# Patient Record
Sex: Male | Born: 1940 | Race: White | Hispanic: No | Marital: Married | Smoking: Never smoker
Health system: Southern US, Community
[De-identification: ages and names within clinical notes are randomized; demographics above are authoritative.]

## PROBLEM LIST (undated history)

## (undated) DIAGNOSIS — E119 Type 2 diabetes mellitus without complications: Secondary | ICD-10-CM

## (undated) DIAGNOSIS — I639 Cerebral infarction, unspecified: Secondary | ICD-10-CM

---

## 2000-11-08 ENCOUNTER — Encounter: Admission: RE | Admit: 2000-11-08 | Discharge: 2000-11-08 | Payer: Self-pay | Admitting: Family Medicine

## 2000-11-08 ENCOUNTER — Encounter: Payer: Self-pay | Admitting: Family Medicine

## 2007-05-03 ENCOUNTER — Encounter (INDEPENDENT_AMBULATORY_CARE_PROVIDER_SITE_OTHER): Payer: Self-pay | Admitting: Pediatrics

## 2007-05-03 ENCOUNTER — Encounter: Payer: Self-pay | Admitting: Emergency Medicine

## 2007-05-03 ENCOUNTER — Ambulatory Visit: Payer: Self-pay | Admitting: Internal Medicine

## 2007-05-03 ENCOUNTER — Encounter (INDEPENDENT_AMBULATORY_CARE_PROVIDER_SITE_OTHER): Payer: Self-pay | Admitting: Internal Medicine

## 2007-05-03 ENCOUNTER — Inpatient Hospital Stay (HOSPITAL_COMMUNITY): Admission: AD | Admit: 2007-05-03 | Discharge: 2007-05-09 | Payer: Self-pay | Admitting: Internal Medicine

## 2007-05-05 ENCOUNTER — Encounter (INDEPENDENT_AMBULATORY_CARE_PROVIDER_SITE_OTHER): Payer: Self-pay | Admitting: Pediatrics

## 2007-05-05 ENCOUNTER — Ambulatory Visit: Payer: Self-pay | Admitting: Physical Medicine & Rehabilitation

## 2007-05-08 ENCOUNTER — Encounter: Payer: Self-pay | Admitting: Internal Medicine

## 2007-05-09 ENCOUNTER — Ambulatory Visit: Payer: Self-pay | Admitting: Physical Medicine & Rehabilitation

## 2007-05-09 ENCOUNTER — Inpatient Hospital Stay (HOSPITAL_COMMUNITY)
Admission: RE | Admit: 2007-05-09 | Discharge: 2007-06-06 | Payer: Self-pay | Admitting: Physical Medicine & Rehabilitation

## 2007-07-15 ENCOUNTER — Encounter
Admission: RE | Admit: 2007-07-15 | Discharge: 2007-07-16 | Payer: Self-pay | Admitting: Physical Medicine & Rehabilitation

## 2007-07-16 ENCOUNTER — Ambulatory Visit: Payer: Self-pay | Admitting: Physical Medicine & Rehabilitation

## 2007-10-06 ENCOUNTER — Encounter
Admission: RE | Admit: 2007-10-06 | Discharge: 2007-12-09 | Payer: Self-pay | Admitting: Physical Medicine & Rehabilitation

## 2007-10-06 ENCOUNTER — Ambulatory Visit: Payer: Self-pay | Admitting: Physical Medicine & Rehabilitation

## 2010-10-15 ENCOUNTER — Encounter: Payer: Self-pay | Admitting: Physical Medicine & Rehabilitation

## 2011-02-06 NOTE — Discharge Summary (Signed)
NAMENATION, CRADLE NO.:  [id number]   MEDICAL RECORD NO.:  [id number]          PATIENT TYPE:  IPS   LOCATION:  4033                         FACILITY:  [facility]   PHYSICIAN:  Ranelle Oyster, M.D.DATE OF BIRTH:  Apr 23, 1941   DATE OF ADMISSION:  05/09/2007  DATE OF DISCHARGE:  06/06/2007                               DISCHARGE SUMMARY   DISCHARGE DIAGNOSIS:  1. Bilateral subcortical infarct.  2. Diabetes mellitus, type 2.  3. Hypertension.  4. Benign prostatic hypertrophy.  5. Dyslipidemia.   DICTATION ENDS HERE      Greg Cutter, P.A.      Ranelle Oyster, M.D.  Electronically Signed    PP/MEDQ  D:  06/06/2007  T:  06/07/2007  Job:  [id number]

## 2011-02-06 NOTE — Assessment & Plan Note (Signed)
HISTORY OF PRESENT ILLNESS:  Mr. [name] is back regarding his  bilateral subcortical strokes. She has been home with home health  therapy. His biggest complaint is right shoulder pain, which seems to be  getting worse, particularly at night time. He has been receiving therapy  daily with PT and OT and is doing well there. He is able to walk around  the house with his walker. He is not using and AFO. His hand has been  slower to return. The patient does report some aching in the shoulder  but really no where else. Sleep has been poor secondary to increased  pain. Family and some of the staff that he has been working with  reported decreased mood. The patient denies that he has a problem and  does not want to take extra medication for this. Sugars have been  better controlled. He does report an occasional constipation.   REVIEW OF SYSTEMS:  Is notable for the items above. Full review is in  the written health and history section.   SOCIAL HISTORY:  The patient is married and family is supportive.   PHYSICAL EXAMINATION:  Blood pressure 121/64, pulse 63, respiratory rate  18, sating 99% on room air.  GENERAL:  Pleasant, alert, and oriented x3.  NEUROLOGIC:  A bit irritable at times. Generally appropriate. The  patient was able to walk with hand held assistance, although he had some  foot drop on the right and some hyper-extension at the knee. He was  fairly stable. Strength was 3 to 3+ out of 5 at the ankle, 3+ to 4 out  of 5 at the knee and hip. He still lacks some proprioception. Sensory  examination appears to be 1 out of 2. Reflexes are a bit hyper-active at  2+ plus. Right upper extremity strength is in the realm of trace to 1+  and variable. He had some tone at the biceps at 1+ out of 4. Shoulder  was tight and had pain with abduction and flexion today. He has positive  rotator cuff signs. Bicipital tendons were minimally tender. There was  1+ swelling at the right hand and wrist  today. Speech was slightly  dysarthric but intelligible. Otherwise, examination is stable.   ASSESSMENT:  1. Bilateral subcortical infarcts.  2. Hypertension.  3. Diabetes.  4. Right rotator cuff tendinitis/adhesive capsulitis.   PLAN:  1. After informed consent, we injected the right shoulder via the      posterior approach with 40 mg of Kenalog and 3 cc of 1% Lidocaine.      The patient tolerated well.  2. Gave patient a few Ultram to use. He will take 1/2 to 1 at bedtime      as needed for breakthrough pain.  3. After pain has subsided a bit, would like to have therapy      aggressively work on range of motion to the right shoulder.  4. Advised the patient to resume Plavix until he follows up with Dr.      Sharene Skeans. Apparent plan before was to look at another bubble study.  5. Continue blood pressure medications including Prinivil and Norvasc.  6. Observe sugars closely, as he has been doing. The patient remains      on Lantus and Amaryl.  7. I will see the patient back in 3 months time, if not sooner for      pain needs.      Ranelle Oyster, M.D.  Electronically Signed  ZTS/MedQ  D:  07/16/2007 10:20:02  T:  07/16/2007 19:34:49  Job #:  [id number]   cc:   Deanna Artis. Sharene Skeans, M.D.  Fax: [phone]   Doris Cheadle. Foy Guadalajara, M.D.  Fax: [phone]

## 2011-02-06 NOTE — Consult Note (Signed)
NAMEROYE, GUSTAFSON NO.:  [id number]   MEDICAL RECORD NO.:  [id number]          PATIENT TYPE:  INP   LOCATION:  3111                         FACILITY:  [facility]   PHYSICIAN:  Deanna Artis. Hickling, M.D.DATE OF BIRTH:  [birth year]   DATE OF CONSULTATION:  05/03/2007  DATE OF DISCHARGE:                                 CONSULTATION   REFERRING PHYSICIAN:  Dr. Cammie Mcgee L. Lendell Caprice   CHIEF COMPLAINT:  Unsteady gait, problems with speech, facial droop and  falling with right-sided weakness.   IDENTIFICATION:  I was asked by Dr. Crista Curb of [facility] to see Mr. Whitman.  The patient had onset of symptoms  around 10 p.m., August 8.  He was walking the dog; as he did so, he felt  quite unsteady on his feet.   A week ago, while pending on over, he fell over and braced himself so  that he would not hit the dog and snagged his head on a stone.  He had a  deep abrasion of the scalp.  He did not have any other focal symptoms at  that time.   When the patient felt dizzy last night, he thought that he had similar  symptoms.  He takes Flomax at nighttime and often is a bit dizzy after  he takes it.   This morning on arising, however, his wife heard fall in the kitchen.  He felt increased weakness on his right side and collapsed.   He was noted to be dysarthric.  He had a slight facial droop.  He had  mild weakness in the right arm and leg.  He had incoordination on the  right side.   The patient was initially evaluated at 10:40 in the morning by Dr.  Lendell Caprice.  I was asked to come see the patient, but was unable to leave  [facility] for [employer].   Around 2:52, the patient's speech worsened, he was drooling, he was  somewhat more sleepy.  He seemed to be picking at the air.  His  dysarthria was greater.  His right facial droop seemed to be worse than  his arm was weaker.   I was contacted and told Dr. Lendell Caprice that this was to be expected from  subcortical stroke.  She informed me and I have since reviewed the MRI  scan that the patient had an acute left putaminal stroke that extends  into the left corona radiata.  The patient also has a right corona  radiata infarction.  These are not the same age; the right side seems a  bit older than the left, based on MRI criteria.  The patient has rather  significant small vessel white matter disease surrounding the white  matter, but not significantly extending into the brain stem.   MRA showed vasculopathy; however, I did not see true vessel occlusion.  The patient seemed to have fairly normal carotid circulation;  There was  some narrowing and irregularity of the vessel inside the head, but no  hemodynamically significant lesion that I could see.  The patient  refused to have an MRA  of the neck.   Because of bilateral lesions and our inability to determine the presence  or absence of significant proximal occlusion of his vessels, the was  placed on heparin and will be on this weekend until we can obtain  noninvasive vascular studies at the beginning of the week.   We also decided to transfer the patient to [facility] for  further workup.   Rick factors for stroke include diabetes mellitus, hypertension,  dyslipidemia and noncompliance with medical regimen.  On many occasions,  his primary physician has requested that he take medication and he has  refused to do so.  He has also refused to have cardiac evaluation,  despite the fact that the patient has had diabetes for over 30 years.  He has become insulin dependent.  I do not know whether he checks his  sugars on a regular basis.   Despite this, the patient is very active.  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  He does not smoke and has not smoked.  His only other medical problem is benign prostatic hypertrophy.   CURRENT MEDICATIONS:  1. Glimepiride 4 mg daily.  2. Lantus 60 units daily.  3. Flomax 0.4  mg daily.  4. He has received NovoLog sliding scale insulin, Vasotec, and      heparin.   We have stopped his oral medications because he failed his swallowing  screen.  Blood pressure has varied between 90 and 210 systolic and 85  and 95 diastolic.   While on 1614 before he was transferred, his blood pressure was down to  170/89.   FAMILY HISTORY:  Positive for stroke in his mother; she died at age 48  of a heart attack.  His father had atherosclerotic cardiovascular  disease as well.   EXAMINATION:  VITAL SIGNS:  On examination today, blood pressure 198/82,  resting pulse 61, respirations 14, oxygen saturation 98%, temperature  97, oxygen saturation 98% on 2 L of oxygen.  ENT:  No infections.  No bruits.  A scalp lesion is present.  LUNGS:  Clear.  HEART:  No murmurs.  Pulses normal.  ABDOMEN:  Soft, nontender.  Bowel sounds normal.  EXTREMITIES:  Well-formed without edema, cyanosis, alterations in tone  or tight heel cords.  There are some scrapes on his legs probably from  falls.  NEUROLOGIC:  Awake, dysarthric, without dysphasia.  Cranial nerves:  Round, reactive pupils.  Visual fields full.  Extraocular movements  full.  He has a right central 7th, midline tongue.  He weakly raises his  uvula and is dysphonic with his voice.  on motor examination, 4+/5  deltoids, 5/5 in all other muscles in the upper extremities and lower  extremities.  He has clumsy fine motor movements and right finger  tapping and finger apposition; he is fine on the left.  He has  cerebellar dystaxia on finger-to-nose on the right; he is okay with  finger-to-nose on the left and heel-knee-shin bilaterally.  Gait was not  tested.  Reflexes were normal to brisk, except the ankles, which were  absent.  He had bilateral flexor plantar responses.  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  He had  good stereoagnosis.   IMPRESSION:  Bilateral subcortical,  non-hemorrhagic infarctions, right  older than the left. (434.01, 433.11) I strongly believe that this is  subcortical white matter disease and probably not embolic; however, I  cannot rule that out and for that reason,  we will keep him on heparin in  therapeutic range with no bolus until we can look at his carotid Doppler  and 2-D echocardiogram.   The patient has significant dysphagia; he failed a swallowing study.  We  will keep him nothing-by-mouth.  He has a mild right hemiparesis and  right hemidystaxia. His NIH stroke scale was 4. His modified Rankin  prior to admission  was 0.  He is hypertensive and possibly requiring parenteral therapy to  keep his blood pressure between 180 and 200 systolic and 80-100  diastolic.  We will provide ongoing assistance to the [facility] Team.  I have discussed the case thoroughly with his wife and  daughter.      Deanna Artis. Sharene Skeans, M.D.  Electronically Signed     [facility]/MEDQ  D:  05/03/2007  T:  05/05/2007  Job:  [id number]   cc:   Molly Maduro L. Foy Guadalajara, M.D.

## 2011-02-06 NOTE — Assessment & Plan Note (Signed)
Mr. [name] is back regarding his bilateral subcortical strokes.  He is  at outpatient PT and OT now through [facility].  He is having it done  the [place] location.  He is walking with a quad cane.  He is  independent with self care and mobility with extra time.  His right  shoulder has been uncomfortable, but much less tender than what it was.  He rates his pain there as a 2 out of 10.  He is not using an AFO on his  right foot.  His mood has been fair to improved.   REVIEW OF SYSTEMS:  Notable for above.  He still reports decreased  balance/dizziness with walking.   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  He wishes to get back to that  and needs to be back on the job by April 1st.   PHYSICAL EXAMINATION:  Blood pressure is 152/74, pulse is 84,  respiratory rate 18, he is satting 98% on room air.  The patient is generally pleasant, alert and oriented x3.  He is less  irritable today.  Speech is slightly dysarthric.  He lacks some proprioception on the  right side, with overall pinprick at 1 out of 2.  Reflexes are 2++ on  the right side.  Strength is 4 out of 5 at the hip and knee, and 3 out  of 5 at the ankle on the right.  Right upper extremity is 1+ to 2 out of  5.  He did  have tightness of abduction and rotation of the shoulder.  He seems to be predominantly tight at the right pectoris major.  HEART:  Regular.  CHEST:  Clear.  ABDOMEN:  Soft, nontender.   ASSESSMENT:  1. Bilateral subcortical infarct.  2. Hypertension.  3. Diabetes.  4. Right rotator cuff tendinitis/adhesive capsulitis.   PLAN:  1. Continue therapy with physical therapy and occupational therapy.  I      encouraged aggressive pectoralis major stretching.  The patient      might benefit from Botox to the pec major.  2. Continue walking with cane.  I suggest focusing therapy towards the      patient's work-related activities.   He seems bound and determined      to get back on the job by April 1st.  I have questions concerning      his safety along these lines.  3. The patient will drive a limited distance to therapy during the      day, but I recommend nothing further than that.  4. Continue Prinivil, Norvasc and Plavix per neurology      recommendations.  5. I will see the patient back in about 2 months' time.      Ranelle Oyster, M.D.  Electronically Signed     ZTS/MedQ  D:  10/06/2007 13:53:53  T:  10/06/2007 18:12:28  Job #:  [id number]   cc:   Deanna Artis. Sharene Skeans, M.D.  Fax: [phone]   Doris Cheadle. Foy Guadalajara, M.D.  Fax: [phone]

## 2011-02-06 NOTE — Discharge Summary (Signed)
NAMEWILLY, [name] NO.:  [id number]   MEDICAL RECORD NO.:  [id number]          PATIENT TYPE:  IPS   LOCATION:  4033                         FACILITY:  [facility]   PHYSICIAN:  Ranelle Oyster, M.D.DATE OF BIRTH:  [birth year]   DATE OF ADMISSION:  05/09/2007  DATE OF DISCHARGE:  06/06/2007                               DISCHARGE SUMMARY   DISCHARGE DIAGNOSES:  1. Bilateral subcortical infarcts.  2. Hypertension.  3. Diabetes mellitus type 2.  4. Dyslipidemia.   HISTORY OF PRESENT ILLNESS:  Mr. Bergfeld is a 70 year old male with  history of diabetes mellitus and chronic low back pain, admitted August  9 with slurred speech, facial droop and right-sided weakness.  MRI of  brain showed left lenticular, left corona radiata, right posterior  corona radiata acute infarcts, moderate small-vessel disease, moderate  intracranial atherosclerosis with question of right vertebral artery  occlusion.  A 2-D echo done showed EF of 60-65% with mild AV  calcification.  Carotid Dopplers done showed left 60-79% ICA stenosis  and right 40-50% ICA stenosis, bilateral vertebral artery flow  antegrade.  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  Neuro recommends Plavix for CVA  prophylaxis with outpatient bubble study in the future.  The patient is  noted to have dysphagia with frank aspiration of nectar and difficulty  with chin tuck, currently on D2 diet, honey-thick liquids.  Currently  continues with dense right hemiparesis with right facial droop and  decrease in mobility.  Has been able to stand and perform some transfers  with right knee block.  Rehab consulted for further therapies.   PAST MEDICAL HISTORY:  1. BPH.  2. DM type 2 for 30 years with diabetic retinopathy.  3. Chronic low back pain.  4. Dyslipidemia.  5. Renal calculi with history of hydronephrosis in the past.   ALLERGIES:  No known drug allergies.   FAMILY  HISTORY:  Positive for CVA and liver cancer.   SOCIAL HISTORY:  The patient is married, lives in a one-level home with  2-3 steps at entry.  Does not use any tobacco or alcohol.  Still works  full-time.  The patient was independent prior to admission.   HOSPITAL COURSE:  Mr. Kmarion Rawl was admitted to rehab on May 09, 2007, for inpatient therapies to consist of PT, OT and speech  therapy.  Past admission the patient was maintained on Plavix for CVA  prophylaxis.  Blood pressures were monitored on b.i.d. basis and  initially showed poor control.  The patient's Norvasc was increased to  10 mg in addition to Prinivil 40 mg a day and at time of discharge blood  pressures ranging from 120s to 130 /20 is to 130 systolic, in 60s range  diastolic.  CBGs were monitored on an a.c., h.s. basis and the patient's  oral hypoglycemic regimen has been titrated for tighter blood sugar  control.  At time of discharge blood sugars ranging from 107 to 150s  range.  The patient's p.o. intake has been good.  Initially the patient  was maintained on  dysphagia diet with a follow-up of electrolytes.  Repeat swallow study was done on August 22 and the patient was advanced  to a regular diet, thin liquids.  He said continues to require chin tuck  with throat-clearing to help clear vallecula residues.  Speech therapy  has been working with the patient for dysphagia treatment and currently  the patient is independent for maintaining through his aspiration  precautions.  He continues to require minimum assist to use strategies  for speech intelligibility.   Labs done past admission revealed hemoglobin 13.6, hematocrit 38.3,  white count 5.8, platelets 186.  Check of electrolytes has shown some  renal insufficiency and the patient is encouraged push p.o. fluids.  Last labs from September 4 revealed sodium 138, potassium 4.0, chloride  106, CO2 26, BUN 24, creatinine 1.18, glucose 100.  The patient has had   multiple questions regarding his medications and medication management  as well as cost.  Both the patient and wife have been instructed about  the importance of medication adherence as well as need for current  medications for blood pressure control as well as for dyslipidemia and  diabetes monitoring.  They are to follow up with Dr. Foy Guadalajara for further  adjustments of medication in the future.  Physical therapy has been  working on the patient in terms of mobility.  Currently the patient is  at minimum assist for ambulating 120 feet with a rolling walker,  occasionally requiring moderate assist secondary to decreased balance.  The patient is modified independent for wheelchair mobility up to 180  feet.  PT has been working on the patient for upright posture as well as  backing up to chair for safety, practice sitting down.  OT has been  working with the patient for range of motion exercises for right  shoulder and right upper extremity use with self-care.  They have also  focused on self-care and balance issues with right knee extension for  peri care and for lower body care.  The patient currently shows  increased active movement, right lower extremity, as well incorporating  the right lower extremity for weight shifting during dynamic balance.  The patient will continue to receive further follow-up home health PT,  OT by [facility] past discharge.  Home health RN has been  arranged to help assist with medication management and diabetes follow-  up.  On June 06, 2007, the patient is discharged to home.   DISCHARGE MEDICATIONS:  1. Flomax 0.4 mg p.o. q.h.s.  2. Prinivil 40 mg a day.  3. Zocor 20 mg q.p.m.  4. Plavix 75 mg a day.  5. Norvasc 10 mg a day.  6. Amaryl 4 mg 1-1/2 p.o. per day.  7. Lantus insulin 75 units q.a.m.  8. Senokot-S two p.o. q.h.s.   ACTIVITIES:  Twenty-four hour supervision and assistance for walking.  No alcohol, no smoking, no driving.    SPECIAL INSTRUCTIONS:  Check blood sugars a.c., h.s.  Follow diabetic  diet.  [facility] to provide PT, OT and RN.   FOLLOW-UP:  The patient to follow up with Dr. Riley Kill October 22 at 9:20  a.m.  Follow up with Dr. Foy Guadalajara September 16 at 1:45.  follow up with Dr.  Sharene Skeans in 1 month for further input regarding repeat bubble study and  further workup.      Greg Cutter, P.A.      Ranelle Oyster, M.D.  Electronically Signed    PP/MEDQ  D:  06/06/2007  T:  06/07/2007  Job:  [id number]   cc:   Deanna Artis. Sharene Skeans, M.D.  Robert L. Foy Guadalajara, M.D.

## 2011-02-06 NOTE — H&P (Signed)
NAMEABHIMANYU, CRUCES NO.:  [id number]   MEDICAL RECORD NO.:  [id number]          PATIENT TYPE:  IPS   LOCATION:  4033                         FACILITY:  [facility]   PHYSICIAN:  Ellwood Dense, M.D.   DATE OF BIRTH:  10-Mar-1941   DATE OF ADMISSION:  05/09/2007  DATE OF DISCHARGE:                              HISTORY & PHYSICAL   PRIMARY CARE PHYSICIAN:  Robert L. Foy Guadalajara, M.D.   NEUROLOGIST:  Deanna Artis. Sharene Skeans, M.D.   CARDIOLOGIST:  Noralyn Pick. Eden Emms, MD, [facility].   HISTORY OF PRESENT ILLNESS:  Mr. Powe is a 70 year old adult male  with a history of diabetes mellitus and chronic low back pain.  He was  admitted on May 03, 2007, with slurred speech along with right facial  droop and right sided weakness.  MRI study of the brain showed left  lenticular, left corona radiata, and right posterior corona radiata  acute infarcts along with moderate small vessel disease and moderate  intracranial atherosclerosis with questionable right vertebral artery  occlusion.  A 2D echocardiogram showed an ejection fraction of 60-65%  with mild aortic valve calcification.  Carotid Doppler showed left  internal carotid artery stenosis at 60-79% and right stenosis at 40-59%.  There was bilateral vertebral artery flow which was antegrade.  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  Neurology  recommended Plavix for stroke prophylaxis and outpatient bubble study to  be done.  The patient has dysphagia along with frank aspiration of  nectar and difficulty with chin tucks.  He has been placed on a D-2  honey-thick liquid diet.  He continues with dense right hemiparesis  along with right facial droop.  Therapy has been ongoing.  The patient  requires total assist approximately doing 75% for transfers with his  right knee blocked.  The patient was evaluated by the rehabilitation  physicians and felt to be an  appropriate candidate for inpatient  rehabilitation.   REVIEW OF SYSTEMS:  Positive for weakness.   PAST MEDICAL HISTORY:  1. Benign prostatic hypertrophy.  2. Type 2 diabetes mellitus x30 years with diabetic retinopathy.  3. Chronic low back pain.  4. Dyslipidemia.  5. History of renal calculi with a history of hydronephrosis.   FAMILY HISTORY:  Positive for stroke and liver cancer.   SOCIAL HISTORY:  The patient is married and lives with his wife in a 1-  level home with 2-3 steps to enter.  he does not use alcohol or tobacco.  He had still been working full time as a Psychologist, forensic.   FUNCTIONAL HISTORY:  Prior to admission independent and working.   ALLERGIES:  No known drug allergies.   MEDICATIONS:  Prior to admission:  1. Flomax 0.4 mg daily.  2. Saw palmetto daily.  3. Glimepiride 4 mg daily.  4. Lantus insulin 60 units q.a.m.   LABORATORY:  Most recent hemoglobin level was 12.8 with a hematocrit of  37, platelet count of 184,000, a white count of 4.8.  Most recent sodium  was 141, potassium 3.8, chloride 106, CO2 27,  BUN 17, and creatinine  1.02 with glucose of 206.  Homocystine level is 14.5.  Total cholesterol  was 186 with HDL cholesterol 29, LDL cholesterol of 100, and  triglycerides of 286.  Chest x-ray, May 03, 2007, showed pulmonary  vascular congestion with possible chronic bronchitic changes with  atelectasis at the base.  Right shoulder x-rays were negative.   PHYSICAL EXAMINATION:  GENERAL:  A reasonably well-appearing, ruddy  complexion, adult male lying in bed in no acute discomfort.  VITAL SIGNS:  Blood pressure is 158/86 with a pulse of 70, respiratory  rate 20, and temperature 98.1 with recent blood sugars of 247, 225, and  330.  HEENT:  Normocephalic, nontraumatic.  CARDIOVASCULAR:  Regular rate and rhythm.  S1 S2 without murmurs.  ABDOMEN:  Soft, nontender with positive bowel sounds.  LUNGS:  Clear to auscultation bilaterally.  NEUROLOGIC:   Alert and oriented x3 with dysarthric speech and facial  weakness on the right with tongue deviated to the right.  EXTREMITIES:  Left upper and left lower extremity  exam showed 5/5  strength to all.  Bulk and tone were normal.  Reflexes were 2 plus and  symmetrical.  Sensation was intact to light touch throughout the left  upper and left lower extremity.  The right upper extremity strength was  0-1/5 throughout.  Bulk was normal and tone was decreased.  Sensation  was intact to light touch throughout the right upper extremity.  The  right lower extremity exam showed 1/5 strength in knee extension and 0-  1/5 strength throughout.  Sensation was intact to light touch on the  right lower extremity.   IMPRESSION:  Status post left lenticular, left corona radiata and right  posterior corona radiata acute infarcts with significant left  hemiparesis along with dysarthria and dysphagia but no significant  aphasia.   Presently, this patient has deficits in ADLs, transfers, ambulation,  speech and swallowing related to the above noted stroke.   PLAN:  Admit to the rehabilitation unit for daily therapy to include:  1. Physical therapy for range of motion, strengthening, bed mobility,      transfers, pre-gait training, gait training, and equipment eval.      2.  Occupational therapy for range of motion, strengthening, ADLs,      cognitive/perceptual training, splinting, and equipment eval.  3.      Rehab nursing for skin care, wound care, bowel and bladder training      as necessary.  2. Speech therapy for oral motor exercises, communication aid, vital      stim, and evaluation of swallow with swallow studies as necessary.  3. Case management to assess home environment , assist with discharge      planning, and arrange for appropriate followup care.  4. Social worker to assess family and social support, counsel the      patient regarding disability issues, and assist in discharge       planning.  5. Continue D-2 honey-thick liquids with full supervision.  6. Flomax 0.4 mg p.o. q.h.s.  7. Protonix 40 mg p.o. daily.  8. Lantus insulin 60 units subcutaneous q.a.m. along with sliding      scale NovoLog insulin and CBG a.c. and h.s.  9. Prinivil 40 mg p.o. daily.  10.Zocor 20 mg p.o. q.p.m.  11.Plavix 75 mg p.o. daily.  12.No straws with meds crushed and administered in puree.  13.Check for pocketing during meals.  14.Catapres 0.1 mg p.o. q.6 h. p.r.n. for a systolic  blood pressure      greater than 180, or a diastolic blood pressure greater than 90.  15.Normal saline IV fluids 75 cc/hr to run 7 p.m. to 7 a.m. daily.  16.Lovenox 40 mg subcutaneous for DVT prophylaxis.  17.E-stim with speech therapy daily.  18.Dulcolax suppository one per rectum daily.  19.Senokot-S 2 tablets p.o. q.h.s.  20.Sorbitol 45 mL p.o. if not results with suppository.  21.Patch right eye at bedtime.  22.Visine saline eye drops, one drop into the right eye q.i.d. and      p.r.n.  23.Keep right upper extremity elevated on pillows when in bed/chair.   PROGNOSIS:  Fair.   ESTIMATED LENGTH OF STAY:  Twenty to thirty days.   GOALS:  Min to mod assist ADLs, transfers, and mod assist ambulation  with modified independent wheelchair mobility and advance diet as  possible.           ______________________________  Ellwood Dense, M.D.     DC/MEDQ  D:  05/09/2007  T:  05/09/2007  Job:  [id number]

## 2011-02-06 NOTE — H&P (Signed)
NAMEGUTHRIE, [name]             ACCOUNT NO.:  [id number]   MEDICAL RECORD NO.:  [id number]          PATIENT TYPE:  INP   LOCATION:  3111                         FACILITY:  [facility]   PHYSICIAN:  Corinna L. Lendell Caprice, MDDATE OF BIRTH:  [birth year]   DATE OF ADMISSION:  05/03/2007  DATE OF DISCHARGE:                              HISTORY & PHYSICAL   STAT ADMISSION HISTORY AND PHYSICAL   CHIEF COMPLAINT:  Trouble walking and slurred speech.   HISTORY OF PRESENT ILLNESS:  Mr. [name] is a pleasant, 70 year old,  white male patient of Dr. Foy Guadalajara, who reportedly presented to the  emergency room at 8:27 this morning.  His family drove him here.  He  noticed that at 10 o'clock last night as he was walking a dog, he had  difficulty walking, he felt dizzy.  He denies vertigo.  He has no  history of stroke.  He is on Flomax and thought that his problems may  have been due to Flomax.  He therefore went to bed.  He woke up this  morning and his wife noticed that he was having slurred speech and right-  sided facial droop.  He was also having difficulty walking.  The patient  reports that he feels a little better.  His wife thinks that the  symptoms have improved since this morning.  A CAT scan of the brain was  done, which showed nothing acute.  I was called for admission.  Simultaneously, the ED physician paged Dr. Sharene Skeans, who agreed to  consult.  After I evaluated the patient and wrote orders, the nurse  called me at 2:52 p.m. about worsened speech and worsened drooling.  The  family also thought that he was a bit worse, and he seemed to be a bit  more confused and picking at the air.  I reexamined the patient and he  did in fact have worsening of his symptoms with worsened slower speech,  more severe dysarthria, he was sleepier, and he had a worse right facial  and right arm weakness.  MRI had been ordered by me earlier this  morning, and MRA of the brain and neck.  The patient was able to  tolerate the MRI and MRA of the head but refused MRA of the neck due to  discomfort.  I immediately reviewed the wet reading of the MRI/MRA of  the brain, which showed acute, small, nonhemorrhagic, left lenticular  nucleus infarct, posterior left coronal radiata infarct, posterior right  coronal radiata infarct, and questionable right vertebral artery  occlusion, also moderate small vessel disease.  My suspicion was that he  was having embolic stroke.  I have discussed the case with Dr. Sharene Skeans,  and we have both agreed that patient should be transferred to [facility].  Also, a neuro-heparin drip protocol will be  instituted per pharmacy.  He agrees with this plan and will evaluate  patient at [facility].  I have updated family members.  He has  no previous history of stroke.  He has a history of diabetes, and denies  a history of  hyperlipidemia.   PAST MEDICAL HISTORY:  1. Diabetes.  2. Chronic back pain.  3. Benign prostatic hypertrophy.   MEDICATIONS:  1. Flomax 0.4 mg a day.  2. Saw palmetto.  3. Glimepiride 4 mg a day.  4. Lantus 60 units subcutaneously q.a.m.   No known drug allergies.   SOCIAL HISTORY:  He denies smoking history or heavy drinking history,  denies drugs, he is married and here with his wife, his daughter has  also arrived and she is a Engineer, civil (consulting) at [employer].   FAMILY HISTORY:  Significant for a stroke in his immediate family.  Also, his brother had liver cancer, which apparently was not alcohol  related.   REVIEW OF SYSTEMS:  Reviewed and as above, otherwise negative.   PHYSICAL EXAMINATION:  VITAL SIGNS:  Temperature is 97.0, initial blood  pressure was 202/87, repeat was 178/92 without any treatment, pulse 61,  respiratory rate 16, oxygen saturation 95% on room air.  GENERAL:  The patient is alert and oriented x3 in no acute distress.  HEENT:  He has an old healing abrasion over his frontal scalp area.  Pupils  equal, round, and reactive to light.  Extraocular movements are  intact.  He has slight flattening of the nasolabial fold.  NECK:  Supple, no carotid bruits, no thyromegaly.  LUNGS:  Clear to auscultation bilaterally without wheezes, rhonchi, or  rales.  CARDIOVASCULAR:  Regular rate, rhythm without murmurs, gallops, or rubs,  ABDOMEN:  Soft, nontender, and nondistended.  GU AND RECTAL:  Deferred.  EXTREMITIES:  No clubbing, cyanosis, or edema, pulses are intact.  SKIN:  Abrasion as noted above.  He also has several healing abrasions  over his left arm and right knee area.  NEUROLOGIC:  The patient is alert and oriented x3, a slight right facial  droop as above.  He is able to follow commands and answer questions  appropriately.  Initially, his speech was somewhat slow and slightly  dysarthric, see above for changes.  Tongue midline, visual fields full,  hearing intact, and cranial nerves II-XII otherwise intact.  His  strength was slightly diminished in the right arm especially proximally.  His strength was slightly diminished in the right leg.  He had poor  finger-to-nose on the right.  Strength and coordination normal on the  left.  I did not test his gait.  Pinprick sensation intact.  Deep tendon  reflexes 2+ and equal in the upper extremities, unable to elicit in the  lower extremities.  Babinski is equivocal bilaterally.  Please see above  for changes in neurologic exam.  PSYCHIATRIC:  Initially, the patient was calm and cooperative with no  signs of hallucinations.  Upon repeat evaluation, he did appear to be  trying to drink without having a cup in his hand but is still  cooperative and calm.   LABORATORY DATA:  CBC unremarkable.  INR 0.9.  Basic metabolic panel  significant for a glucose of 132, otherwise unremarkable.  EKG shows  normal sinus rhythm as reviewed by me previously, but it is not  currently in the chart, but telemetry remains in normal sinus rhythm.  CT of the  brain shows nothing acute.  X-ray of the right shoulder, a  preliminary reading, shows no fracture.  Preliminary MRI/MRA reading of  the brain as above.   ASSESSMENT AND PLAN:  1. 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  [facility].  Neuro-heparin protocol drip will be      instituted and pharmacy is here to start that.  Dr. Sharene Skeans will      consult.  Await echocardiogram, carotid Doppler's.  He has refused      magnetic resonance angiogram of the neck due to discomfort.  I will      check fasting lipids and make patient strict NPO.  He will need a      speech evaluation for swallow function.  He will also eventually      need physical therapy, occupational therapy, and may need a      rehabilitation consultation.  Check homocystine level.  2. Diabetes:  He will get sliding scale insulin for now until he is      able to eat.  I will hold his other medications for now.  3. High blood pressure:  He is not on any antihypertensives and denies      a history of hypertension.  However, his wife thinks that he may      have had a high blood pressure reading in the past.  For now, he      will get Vasotec intravenously as needed for a systolic above 180      and avoid relative hypotension in the setting of an acute stroke.  4. Benign prostatic hypertrophy:  Hold medications for now.  I will      place a Foley catheter.  5. Fall:  Upon further questioning about the healing abrasions,      patient reported that he had leaned over and fallen and struck his      head as well as scraped his left arm and right knee.  He reports      that he often feels dizzy after leaning over, which he contributes      to Flomax.  This was a week ago.  He had no other symptoms at that      time and apparently did not present to a physician.  Certainly the      patient will need fall precautions.   In  addition to above, the patient will get neuro checks.  I have  discussed the events and my plans with the family members.      Corinna L. Lendell Caprice, MD  Electronically Signed     CLS/MEDQ  D:  05/03/2007  T:  05/03/2007  Job:  [id number]   cc:   Molly Maduro L. Foy Guadalajara, M.D.  Fax: [phone]

## 2011-02-09 NOTE — Discharge Summary (Signed)
[name], DIMARTINO NO.:  [id number]   MEDICAL RECORD NO.:  [id number]          PATIENT TYPE:  INP   LOCATION:  6738                         FACILITY:  [facility]   PHYSICIAN:  Barnetta Chapel, MDDATE OF BIRTH:  [birth year]   DATE OF ADMISSION:  05/03/2007  DATE OF DISCHARGE:  05/09/2007                               DISCHARGE SUMMARY   PRIMARY CARE Layni Kreamer:  Dr. Marinda Elk.   DISCHARGE DIAGNOSES:  1. Cerebrovascular accident.  2. Diabetes mellitus.  3. Hypertension.  4. Benign prostatic hypertrophy.   CONSULTANT:  1. Cardiology consult.  2. Rehab consult.  The patient was transferred to the rehab unit on      May 09, 2007.   DISCHARGE MEDICATIONS:  Please refer to the final discharge meds that  will be dictated by the rehab team.   BRIEF HISTORY AND HOSPITAL COURSE:  Please refer to the H&P done on  May 03, 2007. The patient is a 70 year old male with past medical  history significant for diabetes, chronic back pain and BPH.  The  patient was admitted with difficulty walking and slurred speech.  The  patient was admitted for further workup and management of CVA.   The patient was admitted to telemetry bed.  CVA was managed with  antiplatelets.  The patient was adequately worked up for possible cause  of the CVA.  The Doppler ultrasound of the carotids done revealed a 60-  79% stenosis on the left.  A cardiology consult was called for a  possible TEE. The plan was to start the patient on IV heparin if the TEE  showed any significant findings.  The patient remained essentially the  same with only mild to minimal ain improvement.  The right hemiplegia  persisted.  The patient was eventually transferred to the rehab team for  further management.      Barnetta Chapel, MD  Electronically Signed     SIO/MEDQ  D:  08/17/2007  T:  08/18/2007  Job:  [id number]   cc:   Molly Maduro L. Foy Guadalajara, M.D.

## 2011-02-23 ENCOUNTER — Encounter (HOSPITAL_COMMUNITY)
Admission: RE | Admit: 2011-02-23 | Discharge: 2011-02-23 | Disposition: A | Discharge status: Home / Self Care | Payer: Medicare Other | Source: Ambulatory Visit | Attending: Ophthalmology | Admitting: Ophthalmology

## 2011-02-23 ENCOUNTER — Ambulatory Visit (HOSPITAL_COMMUNITY)
Admission: RE | Admit: 2011-02-23 | Discharge: 2011-02-23 | Disposition: A | Discharge status: Home / Self Care | Payer: Medicare Other | Source: Ambulatory Visit | Attending: Ophthalmology | Admitting: Ophthalmology

## 2011-02-23 ENCOUNTER — Other Ambulatory Visit (HOSPITAL_COMMUNITY): Payer: Self-pay | Admitting: Ophthalmology

## 2011-02-23 DIAGNOSIS — Z01818 Encounter for other preprocedural examination: Secondary | ICD-10-CM

## 2011-02-23 DIAGNOSIS — Z01812 Encounter for preprocedural laboratory examination: Secondary | ICD-10-CM | POA: Insufficient documentation

## 2011-02-23 DIAGNOSIS — Z01810 Encounter for preprocedural cardiovascular examination: Secondary | ICD-10-CM | POA: Insufficient documentation

## 2011-02-23 DIAGNOSIS — I517 Cardiomegaly: Secondary | ICD-10-CM | POA: Insufficient documentation

## 2011-02-23 LAB — BASIC METABOLIC PANEL
BUN: 20 mg/dL (ref 6–23)
CO2: 30 mEq/L (ref 19–32)
Calcium: 9.6 mg/dL (ref 8.4–10.5)
Creatinine, Ser: 1.48 mg/dL (ref 0.4–1.5)
Glucose, Bld: 131 mg/dL — ABNORMAL HIGH (ref 70–99)

## 2011-02-23 LAB — CBC
HCT: 41 % (ref 39.0–52.0)
Hemoglobin: 14.4 g/dL (ref 13.0–17.0)
MCH: 32.1 pg (ref 26.0–34.0)
MCHC: 35.1 g/dL (ref 30.0–36.0)

## 2011-02-26 ENCOUNTER — Ambulatory Visit (HOSPITAL_COMMUNITY)
Admission: RE | Admit: 2011-02-26 | Discharge: 2011-02-26 | Disposition: A | Discharge status: Home / Self Care | Payer: Medicare Other | Source: Ambulatory Visit | Attending: Ophthalmology | Admitting: Ophthalmology

## 2011-02-26 DIAGNOSIS — E1139 Type 2 diabetes mellitus with other diabetic ophthalmic complication: Secondary | ICD-10-CM | POA: Insufficient documentation

## 2011-02-26 DIAGNOSIS — H3340 Traction detachment of retina, unspecified eye: Secondary | ICD-10-CM | POA: Insufficient documentation

## 2011-02-26 DIAGNOSIS — E11359 Type 2 diabetes mellitus with proliferative diabetic retinopathy without macular edema: Secondary | ICD-10-CM | POA: Insufficient documentation

## 2011-03-14 NOTE — Op Note (Signed)
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  PROCEDURE PERFORMED:  Pars plana vitrectomy with membrane peeling, retinectomy, fluid gas exchange, endolaser and silicone oil.  SURGEON:  Shade Flood, MD.  COMPLICATIONS:  None.  SPECIMENS:  None for pathology.  The patient was prepared and draped in the usual fashion for ocular surgery on the right eye, and a solid lid speculum was placed.  A trocar cannula was placed at the 7:30 meridian, 3.5 mm posterior to the surgical limbus with conjunctival displacement.  The tip of the cannula was visualized in the vitreous cavity.  The infusion line was then allowed to flow and then clamped and placed into the cannula.  This was then secured to the field with an adherent strip.  Two additional trocar cannulas were placed at 9:30 and 2:30, and the light pipe and vitreous cutter were inserted.    The patient was noted to have extensive peripheral retinal capillary loss and vascular sclerosis with the blood vessels in the temporal retina being white with no patent blood vessels apparent.  The patient had extensive fibrosis over both the superior and inferior arcade with temporal tractional retinal detachment at the junction between macula and peripheral retina.  The vitreous cutter was used to perform core vitrectomy followed by separation of any anterior posterior tractional elements separating the anterior vitreous base from the fibrovascular  tissue in the posterior pole.  Care was taken to remove vitreous up to the vitreous base for 360 degrees.    Vertical scissors were used to segment the fibrovascular tissue and pick forceps were used to strip neurovascular tissue from the optic nerve and the nasal portion of the arcade blood vessels.  The temporal portion of the fibrovascular tissue in the mid-periphery was very firmly adherent to very friable retina. The vitreous cutter was used to reduce the fibrovascular tissue volume, and then the horizontal scissors were used to delaminate fibrovascular tissue from the arcade blood vessels.  We were successful in getting fibrovascular tissue off of the arcade blood vessels in the macula.  There were two large patches of very adherent fibrovascular tissue temporally, and it was not possible to separate them with scissors as the retina was very deeply infolded in this area with tractional detachment.  Much care was taken in an attempt to remove this fibrovascular tissue, but it was felt that trying to pull or further use of scissors might result in inadvertent tears in the retina.  The vitreous cutter was used to remove these sections of fibrovascular tissue by using a planned retinectomy temporally which facilitated the retina being freely mobile and able to be reattached.    After the purposeful retinectomy, total air-fluid exchange was carried out,  and endolaser was applied in a peripheral panretinal photocoagulation pattern for the entire posterior pole beyong the arcade vessels. The retinectomy was between 8 o'clock to 10 o'clock hour temporally in mid-peripehral retina to anterior retina.    After completion of endolaser, the soft tip cannula was used to remove any residual preretinal fluid and then silicone oil was injected using through the 23-gauge cannula.  As the silicone oil reached the posterior surface of the intraocular lens, the infusion line was clamped, and the  cannula at the 2:30 meridian was used to vent any residual air from the posterior chamber using a 26-gauge needle. The ocular pressure was palpably less than 10.  The cannula at the 2:30 meridian was removed then followed by removal at the 9:30 meridian.  A concomitant closure was obtained using a cotton tip applicator.  The infusion line was then removed with similar concomitant closure using a cotton tip applicator.  The ocular pressure was again checked and again was palpably less than 10.    The patient was given subconjunctival Ancef 100 mg and 4 mg of Decadron subconjunctivally with the tip of the needle visualized during the injection.  The lid speculum was removed and was patched using Polymyxin/ Bacitracin ointment.  A plastic shield was placed.  He was uneventfully extubated and transferred from the operating room to the postoperative recovery area with stable vital signs.          ______________________________ Shade Flood, MD     GG/MEDQ  D:  02/28/2011  T:  03/01/2011  Job:  [id number]  Electronically Signed by Shade Flood MD on 03/14/2011 03:31:16 PM

## 2011-07-06 LAB — URINE CULTURE

## 2011-07-06 LAB — DIFFERENTIAL
Basophils Relative: 1
Eosinophils Relative: 4
Monocytes Absolute: 0.6
Monocytes Relative: 11
Neutro Abs: 3.5

## 2011-07-06 LAB — BASIC METABOLIC PANEL
BUN: 16
BUN: 25 — ABNORMAL HIGH
CO2: 27
CO2: 27
Calcium: 9.3
Calcium: 9.6
Chloride: 106
Chloride: 103
Chloride: 106
Creatinine, Ser: 1.18
Creatinine, Ser: 1.07
Creatinine, Ser: 1.24
GFR calc Af Amer: >60
GFR calc Af Amer: >60
GFR calc non Af Amer: >60
Glucose, Bld: 159 — ABNORMAL HIGH
Potassium: 4
Potassium: 3.8
Potassium: 4.1
Sodium: 138
Sodium: 141

## 2011-07-06 LAB — COMPREHENSIVE METABOLIC PANEL
ALT: 53
AST: 29
Albumin: 3.2 — ABNORMAL LOW
Alkaline Phosphatase: 66
BUN: 14
CO2: 30
Glucose, Bld: 226 — ABNORMAL HIGH
Total Bilirubin: 0.9
Total Protein: 6

## 2011-07-06 LAB — URINALYSIS, ROUTINE W REFLEX MICROSCOPIC
Bilirubin Urine: NEGATIVE
Ketones, ur: NEGATIVE
Nitrite: NEGATIVE
Specific Gravity, Urine: 1.015
Urobilinogen, UA: 0.2

## 2011-07-06 LAB — CBC
HCT: 38.3 — ABNORMAL LOW
Hemoglobin: 12.8 — ABNORMAL LOW
Hemoglobin: 13.6
MCHC: 34.7
MCHC: 35.6
MCV: 90.8
RBC: 4.03 — ABNORMAL LOW
RBC: 4.22

## 2011-07-06 LAB — URINE MICROSCOPIC-ADD ON

## 2011-07-06 LAB — B-NATRIURETIC PEPTIDE (CONVERTED LAB): Pro B Natriuretic peptide (BNP): <30

## 2011-07-09 LAB — CARDIAC PANEL(CRET KIN+CKTOT+MB+TROPI)
CK, MB: 2.8
CK, MB: 3.1
Relative Index: 2.2
Relative Index: 2.2

## 2011-07-09 LAB — LIPID PANEL
Cholesterol: 186
LDL Cholesterol: 100 — ABNORMAL HIGH
Triglycerides: 286 — ABNORMAL HIGH

## 2011-07-09 LAB — CBC
HCT: 42.9
HCT: 43.1
Hemoglobin: 13.7
Hemoglobin: 14.8
Hemoglobin: 14.8
MCHC: 34.9
MCHC: 35.1
MCHC: 35.6
MCV: 91.5
MCV: 91.8
Platelets: 176
Platelets: 259
RBC: 4.28
RBC: 4.64
RBC: 4.67
RBC: 4.69
RDW: 12.6
RDW: 12.8
RDW: 12.9
RDW: 13
RDW: 13.1
WBC: 6.9
WBC: 7.3
WBC: 8.8

## 2011-07-09 LAB — BASIC METABOLIC PANEL
CO2: 30
Calcium: 9.1
Calcium: 9.6
Creatinine, Ser: 1.09
GFR calc Af Amer: >60
GFR calc Af Amer: >60
GFR calc non Af Amer: >60
Glucose, Bld: 132 — ABNORMAL HIGH
Potassium: 3.6
Sodium: 139

## 2011-07-09 LAB — DIFFERENTIAL
Basophils Absolute: 0
Basophils Relative: 0
Lymphs Abs: 1.4
Neutrophils Relative %: 65

## 2011-07-09 LAB — COMPREHENSIVE METABOLIC PANEL
ALT: 28
Alkaline Phosphatase: 71
BUN: 10
CO2: 29
Calcium: 9.2
GFR calc non Af Amer: >60
Glucose, Bld: 107 — ABNORMAL HIGH
Sodium: 140
Total Protein: 6.7

## 2011-07-09 LAB — HEPARIN LEVEL (UNFRACTIONATED)
Heparin Unfractionated: 0.18 — ABNORMAL LOW
Heparin Unfractionated: 0.2 — ABNORMAL LOW
Heparin Unfractionated: 0.28 — ABNORMAL LOW
Heparin Unfractionated: 0.32
Heparin Unfractionated: 0.33
Heparin Unfractionated: 0.37

## 2011-07-09 LAB — HEMOGLOBIN A1C
Hgb A1c MFr Bld: 8.3 — ABNORMAL HIGH
Mean Plasma Glucose: 215

## 2011-07-09 LAB — PROTIME-INR
INR: 0.9
Prothrombin Time: 12.5
Prothrombin Time: 13.2

## 2011-07-09 LAB — APTT: aPTT: 46 — ABNORMAL HIGH

## 2011-07-09 LAB — HOMOCYSTEINE: Homocysteine: 10.5

## 2015-09-27 ENCOUNTER — Inpatient Hospital Stay (HOSPITAL_COMMUNITY)
Admission: EM | Admit: 2015-09-27 | Discharge: 2015-10-26 | DRG: 637 | Disposition: E | Payer: Medicare Other | Attending: Internal Medicine | Admitting: Internal Medicine

## 2015-09-27 ENCOUNTER — Emergency Department (HOSPITAL_COMMUNITY): Payer: Medicare Other

## 2015-09-27 ENCOUNTER — Inpatient Hospital Stay (HOSPITAL_COMMUNITY): Payer: Medicare Other

## 2015-09-27 ENCOUNTER — Encounter (HOSPITAL_COMMUNITY): Payer: Self-pay | Admitting: Emergency Medicine

## 2015-09-27 DIAGNOSIS — Z8673 Personal history of transient ischemic attack (TIA), and cerebral infarction without residual deficits: Secondary | ICD-10-CM

## 2015-09-27 DIAGNOSIS — J9601 Acute respiratory failure with hypoxia: Secondary | ICD-10-CM | POA: Diagnosis not present

## 2015-09-27 DIAGNOSIS — G7281 Critical illness myopathy: Secondary | ICD-10-CM | POA: Diagnosis not present

## 2015-09-27 DIAGNOSIS — E876 Hypokalemia: Secondary | ICD-10-CM | POA: Diagnosis present

## 2015-09-27 DIAGNOSIS — I340 Nonrheumatic mitral (valve) insufficiency: Secondary | ICD-10-CM | POA: Diagnosis present

## 2015-09-27 DIAGNOSIS — Z515 Encounter for palliative care: Secondary | ICD-10-CM | POA: Insufficient documentation

## 2015-09-27 DIAGNOSIS — R1310 Dysphagia, unspecified: Secondary | ICD-10-CM | POA: Diagnosis present

## 2015-09-27 DIAGNOSIS — E113599 Type 2 diabetes mellitus with proliferative diabetic retinopathy without macular edema, unspecified eye: Secondary | ICD-10-CM | POA: Diagnosis present

## 2015-09-27 DIAGNOSIS — R0600 Dyspnea, unspecified: Secondary | ICD-10-CM

## 2015-09-27 DIAGNOSIS — K760 Fatty (change of) liver, not elsewhere classified: Secondary | ICD-10-CM | POA: Diagnosis present

## 2015-09-27 DIAGNOSIS — Z66 Do not resuscitate: Secondary | ICD-10-CM

## 2015-09-27 DIAGNOSIS — R001 Bradycardia, unspecified: Secondary | ICD-10-CM | POA: Diagnosis present

## 2015-09-27 DIAGNOSIS — I5021 Acute systolic (congestive) heart failure: Secondary | ICD-10-CM | POA: Diagnosis present

## 2015-09-27 DIAGNOSIS — E860 Dehydration: Secondary | ICD-10-CM | POA: Insufficient documentation

## 2015-09-27 DIAGNOSIS — E875 Hyperkalemia: Secondary | ICD-10-CM | POA: Diagnosis present

## 2015-09-27 DIAGNOSIS — K625 Hemorrhage of anus and rectum: Secondary | ICD-10-CM | POA: Diagnosis not present

## 2015-09-27 DIAGNOSIS — E1110 Type 2 diabetes mellitus with ketoacidosis without coma: Secondary | ICD-10-CM | POA: Diagnosis present

## 2015-09-27 DIAGNOSIS — I4510 Unspecified right bundle-branch block: Secondary | ICD-10-CM | POA: Diagnosis not present

## 2015-09-27 DIAGNOSIS — I959 Hypotension, unspecified: Secondary | ICD-10-CM | POA: Diagnosis present

## 2015-09-27 DIAGNOSIS — E871 Hypo-osmolality and hyponatremia: Secondary | ICD-10-CM | POA: Diagnosis present

## 2015-09-27 DIAGNOSIS — R0602 Shortness of breath: Secondary | ICD-10-CM

## 2015-09-27 DIAGNOSIS — I441 Atrioventricular block, second degree: Secondary | ICD-10-CM | POA: Diagnosis present

## 2015-09-27 DIAGNOSIS — I255 Ischemic cardiomyopathy: Secondary | ICD-10-CM | POA: Diagnosis present

## 2015-09-27 DIAGNOSIS — E872 Acidosis, unspecified: Secondary | ICD-10-CM | POA: Insufficient documentation

## 2015-09-27 DIAGNOSIS — N179 Acute kidney failure, unspecified: Secondary | ICD-10-CM | POA: Diagnosis present

## 2015-09-27 DIAGNOSIS — E1122 Type 2 diabetes mellitus with diabetic chronic kidney disease: Secondary | ICD-10-CM | POA: Diagnosis present

## 2015-09-27 DIAGNOSIS — E11649 Type 2 diabetes mellitus with hypoglycemia without coma: Secondary | ICD-10-CM | POA: Diagnosis present

## 2015-09-27 DIAGNOSIS — R9401 Abnormal electroencephalogram [EEG]: Secondary | ICD-10-CM | POA: Diagnosis present

## 2015-09-27 DIAGNOSIS — I700 Atherosclerosis of aorta: Secondary | ICD-10-CM | POA: Diagnosis present

## 2015-09-27 DIAGNOSIS — E1310 Other specified diabetes mellitus with ketoacidosis without coma: Secondary | ICD-10-CM | POA: Diagnosis not present

## 2015-09-27 DIAGNOSIS — E861 Hypovolemia: Secondary | ICD-10-CM | POA: Diagnosis present

## 2015-09-27 DIAGNOSIS — R34 Anuria and oliguria: Secondary | ICD-10-CM | POA: Diagnosis present

## 2015-09-27 DIAGNOSIS — E870 Hyperosmolality and hypernatremia: Secondary | ICD-10-CM | POA: Diagnosis not present

## 2015-09-27 DIAGNOSIS — R7989 Other specified abnormal findings of blood chemistry: Secondary | ICD-10-CM

## 2015-09-27 DIAGNOSIS — K7200 Acute and subacute hepatic failure without coma: Secondary | ICD-10-CM | POA: Diagnosis present

## 2015-09-27 DIAGNOSIS — H540 Blindness, both eyes: Secondary | ICD-10-CM | POA: Diagnosis present

## 2015-09-27 DIAGNOSIS — R740 Nonspecific elevation of levels of transaminase and lactic acid dehydrogenase [LDH]: Secondary | ICD-10-CM | POA: Diagnosis not present

## 2015-09-27 DIAGNOSIS — G9341 Metabolic encephalopathy: Secondary | ICD-10-CM | POA: Diagnosis present

## 2015-09-27 DIAGNOSIS — J690 Pneumonitis due to inhalation of food and vomit: Secondary | ICD-10-CM | POA: Clinically undetermined

## 2015-09-27 DIAGNOSIS — I214 Non-ST elevation (NSTEMI) myocardial infarction: Secondary | ICD-10-CM | POA: Diagnosis present

## 2015-09-27 DIAGNOSIS — J9600 Acute respiratory failure, unspecified whether with hypoxia or hypercapnia: Secondary | ICD-10-CM | POA: Diagnosis not present

## 2015-09-27 DIAGNOSIS — I452 Bifascicular block: Secondary | ICD-10-CM | POA: Diagnosis not present

## 2015-09-27 DIAGNOSIS — I69351 Hemiplegia and hemiparesis following cerebral infarction affecting right dominant side: Secondary | ICD-10-CM | POA: Diagnosis not present

## 2015-09-27 DIAGNOSIS — N183 Chronic kidney disease, stage 3 unspecified: Secondary | ICD-10-CM | POA: Diagnosis present

## 2015-09-27 DIAGNOSIS — R4182 Altered mental status, unspecified: Secondary | ICD-10-CM

## 2015-09-27 DIAGNOSIS — I480 Paroxysmal atrial fibrillation: Secondary | ICD-10-CM | POA: Diagnosis present

## 2015-09-27 DIAGNOSIS — E0810 Diabetes mellitus due to underlying condition with ketoacidosis without coma: Secondary | ICD-10-CM | POA: Diagnosis not present

## 2015-09-27 DIAGNOSIS — R197 Diarrhea, unspecified: Secondary | ICD-10-CM

## 2015-09-27 DIAGNOSIS — I213 ST elevation (STEMI) myocardial infarction of unspecified site: Secondary | ICD-10-CM | POA: Diagnosis not present

## 2015-09-27 DIAGNOSIS — R112 Nausea with vomiting, unspecified: Secondary | ICD-10-CM

## 2015-09-27 DIAGNOSIS — Z794 Long term (current) use of insulin: Secondary | ICD-10-CM | POA: Diagnosis not present

## 2015-09-27 DIAGNOSIS — R7401 Elevation of levels of liver transaminase levels: Secondary | ICD-10-CM | POA: Diagnosis present

## 2015-09-27 HISTORY — DX: Type 2 diabetes mellitus without complications: E11.9

## 2015-09-27 HISTORY — DX: Cerebral infarction, unspecified: I63.9

## 2015-09-27 LAB — CBC
HEMATOCRIT: 34.4 % — AB (ref 39.0–52.0)
HEMATOCRIT: 33.7 % — AB (ref 39.0–52.0)
HEMOGLOBIN: 11.3 g/dL — AB (ref 13.0–17.0)
Hemoglobin: 11.2 g/dL — ABNORMAL LOW (ref 13.0–17.0)
MCH: 31.8 pg (ref 26.0–34.0)
MCH: 32.3 pg (ref 26.0–34.0)
MCHC: 32.6 g/dL (ref 30.0–36.0)
MCHC: 33.5 g/dL (ref 30.0–36.0)
MCV: 97.7 fL (ref 78.0–100.0)
MCV: 96.3 fL (ref 78.0–100.0)
PLATELETS: 158 10*3/uL (ref 150–400)
Platelets: 124 10*3/uL — ABNORMAL LOW (ref 150–400)
RBC: 3.52 MIL/uL — ABNORMAL LOW (ref 4.22–5.81)
RBC: 3.5 MIL/uL — AB (ref 4.22–5.81)
RDW: 13.6 % (ref 11.5–15.5)
RDW: 13.4 % (ref 11.5–15.5)
WBC: 7.7 10*3/uL (ref 4.0–10.5)
WBC: 5.8 10*3/uL (ref 4.0–10.5)

## 2015-09-27 LAB — I-STAT CHEM 8, ED
BUN: 43 mg/dL — AB (ref 6–20)
BUN: 56 mg/dL — AB (ref 6–20)
CREATININE: 3 mg/dL — AB (ref 0.61–1.24)
Calcium, Ion: 0.99 mmol/L — ABNORMAL LOW (ref 1.13–1.30)
Calcium, Ion: 1.14 mmol/L (ref 1.13–1.30)
Chloride: 105 mmol/L (ref 101–111)
Chloride: 107 mmol/L (ref 101–111)
Creatinine, Ser: 3.2 mg/dL — ABNORMAL HIGH (ref 0.61–1.24)
GLUCOSE: 508 mg/dL — AB (ref 65–99)
Glucose, Bld: 532 mg/dL — ABNORMAL HIGH (ref 65–99)
HEMATOCRIT: 36 % — AB (ref 39.0–52.0)
HEMATOCRIT: 35 % — AB (ref 39.0–52.0)
HEMOGLOBIN: 11.9 g/dL — AB (ref 13.0–17.0)
Hemoglobin: 12.2 g/dL — ABNORMAL LOW (ref 13.0–17.0)
POTASSIUM: 6.6 mmol/L — AB (ref 3.5–5.1)
POTASSIUM: 6.2 mmol/L — AB (ref 3.5–5.1)
SODIUM: 138 mmol/L (ref 135–145)
Sodium: 134 mmol/L — ABNORMAL LOW (ref 135–145)
TCO2: 15 mmol/L (ref 0–100)
TCO2: 18 mmol/L (ref 0–100)

## 2015-09-27 LAB — COMPREHENSIVE METABOLIC PANEL
ALT: 89 U/L — ABNORMAL HIGH (ref 17–63)
AST: 111 U/L — AB (ref 15–41)
Albumin: 3.2 g/dL — ABNORMAL LOW (ref 3.5–5.0)
Alkaline Phosphatase: 52 U/L (ref 38–126)
Anion gap: 18 — ABNORMAL HIGH (ref 5–15)
BILIRUBIN TOTAL: 3 mg/dL — AB (ref 0.3–1.2)
BUN: 44 mg/dL — AB (ref 6–20)
CALCIUM: 8.4 mg/dL — AB (ref 8.9–10.3)
CO2: 15 mmol/L — ABNORMAL LOW (ref 22–32)
Chloride: 102 mmol/L (ref 101–111)
Creatinine, Ser: 3.89 mg/dL — ABNORMAL HIGH (ref 0.61–1.24)
GFR calc Af Amer: 16 mL/min — ABNORMAL LOW (ref >60)
GFR, EST NON AFRICAN AMERICAN: 14 mL/min — AB (ref >60)
GLUCOSE: 586 mg/dL — AB (ref 65–99)
POTASSIUM: 7 mmol/L — AB (ref 3.5–5.1)
Sodium: 135 mmol/L (ref 135–145)
TOTAL PROTEIN: 6 g/dL — AB (ref 6.5–8.1)

## 2015-09-27 LAB — BASIC METABOLIC PANEL
ANION GAP: 16 — AB (ref 5–15)
ANION GAP: 20 — AB (ref 5–15)
BUN: 48 mg/dL — ABNORMAL HIGH (ref 6–20)
BUN: 44 mg/dL — ABNORMAL HIGH (ref 6–20)
CALCIUM: 9.4 mg/dL (ref 8.9–10.3)
CHLORIDE: 106 mmol/L (ref 101–111)
CHLORIDE: 105 mmol/L (ref 101–111)
CO2: 16 mmol/L — AB (ref 22–32)
CO2: 15 mmol/L — AB (ref 22–32)
Calcium: 9 mg/dL (ref 8.9–10.3)
Creatinine, Ser: 3.09 mg/dL — ABNORMAL HIGH (ref 0.61–1.24)
Creatinine, Ser: 3.64 mg/dL — ABNORMAL HIGH (ref 0.61–1.24)
GFR calc Af Amer: 21 mL/min — ABNORMAL LOW (ref >60)
GFR calc Af Amer: 17 mL/min — ABNORMAL LOW (ref >60)
GFR calc non Af Amer: 18 mL/min — ABNORMAL LOW (ref >60)
GFR calc non Af Amer: 15 mL/min — ABNORMAL LOW (ref >60)
GLUCOSE: 514 mg/dL — AB (ref 65–99)
GLUCOSE: 507 mg/dL — AB (ref 65–99)
POTASSIUM: 6.4 mmol/L — AB (ref 3.5–5.1)
POTASSIUM: 4.6 mmol/L (ref 3.5–5.1)
Sodium: 138 mmol/L (ref 135–145)
Sodium: 140 mmol/L (ref 135–145)

## 2015-09-27 LAB — MAGNESIUM: Magnesium: 1.9 mg/dL (ref 1.7–2.4)

## 2015-09-27 LAB — DIFFERENTIAL
BASOS ABS: 0 10*3/uL (ref 0.0–0.1)
Basophils Relative: 0 %
EOS ABS: 0 10*3/uL (ref 0.0–0.7)
EOS PCT: 0 %
LYMPHS ABS: 0.7 10*3/uL (ref 0.7–4.0)
LYMPHS PCT: 12 %
MONOS PCT: 5 %
Monocytes Absolute: 0.3 10*3/uL (ref 0.1–1.0)
Neutro Abs: 4.8 10*3/uL (ref 1.7–7.7)
Neutrophils Relative %: 83 %

## 2015-09-27 LAB — I-STAT CG4 LACTIC ACID, ED
Lactic Acid, Venous: 7.37 mmol/L (ref 0.5–2.0)
Lactic Acid, Venous: 7.04 mmol/L (ref 0.5–2.0)

## 2015-09-27 LAB — PROCALCITONIN: Procalcitonin: 0.26 ng/mL

## 2015-09-27 LAB — PROTIME-INR
INR: 1.37 (ref 0.00–1.49)
PROTHROMBIN TIME: 17 s — AB (ref 11.6–15.2)

## 2015-09-27 LAB — CBG MONITORING, ED: Glucose-Capillary: 430 mg/dL — ABNORMAL HIGH (ref 65–99)

## 2015-09-27 LAB — I-STAT TROPONIN, ED: Troponin i, poc: 5.52 ng/mL (ref 0.00–0.08)

## 2015-09-27 LAB — BETA-HYDROXYBUTYRIC ACID: Beta-Hydroxybutyric Acid: 1.78 mmol/L — ABNORMAL HIGH (ref 0.05–0.27)

## 2015-09-27 LAB — GLUCOSE, CAPILLARY: GLUCOSE-CAPILLARY: 444 mg/dL — AB (ref 65–99)

## 2015-09-27 LAB — LACTIC ACID, PLASMA: LACTIC ACID, VENOUS: 6.9 mmol/L — AB (ref 0.5–2.0)

## 2015-09-27 LAB — MRSA PCR SCREENING: MRSA BY PCR: NEGATIVE

## 2015-09-27 LAB — PHOSPHORUS: Phosphorus: 4.6 mg/dL (ref 2.5–4.6)

## 2015-09-27 LAB — APTT: aPTT: 30 seconds (ref 24–37)

## 2015-09-27 LAB — TSH: TSH: 3.873 u[IU]/mL (ref 0.350–4.500)

## 2015-09-27 MED ORDER — CALCIUM GLUCONATE 10 % IV SOLN
1.0000 g | Freq: Once | INTRAVENOUS | Status: AC
  Administered 2015-09-27: 1 g via INTRAVENOUS
  Filled 2015-09-27: qty 10

## 2015-09-27 MED ORDER — ASPIRIN 81 MG PO CHEW
324.0000 mg | CHEWABLE_TABLET | Freq: Once | ORAL | Status: AC
  Administered 2015-09-27: 324 mg via ORAL
  Filled 2015-09-27: qty 4

## 2015-09-27 MED ORDER — SODIUM BICARBONATE 8.4 % IV SOLN
50.0000 meq | Freq: Once | INTRAVENOUS | Status: AC
  Administered 2015-09-27: 50 meq via INTRAVENOUS

## 2015-09-27 MED ORDER — SODIUM CHLORIDE 0.9 % IV BOLUS (SEPSIS)
1000.0000 mL | INTRAVENOUS | Status: AC
  Administered 2015-09-27 (×3): 1000 mL via INTRAVENOUS

## 2015-09-27 MED ORDER — INSULIN REGULAR HUMAN 100 UNIT/ML IJ SOLN
INTRAMUSCULAR | Status: DC
  Administered 2015-09-27: 3.8 [IU]/h via INTRAVENOUS
  Filled 2015-09-27: qty 2.5

## 2015-09-27 MED ORDER — PIPERACILLIN-TAZOBACTAM 3.375 G IVPB
3.3750 g | Freq: Three times a day (TID) | INTRAVENOUS | Status: DC
Start: 2015-09-27 — End: 2015-10-02
  Administered 2015-09-27 – 2015-10-02 (×15): 3.375 g via INTRAVENOUS
  Filled 2015-09-27 (×19): qty 50

## 2015-09-27 MED ORDER — PIPERACILLIN-TAZOBACTAM 3.375 G IVPB 30 MIN
3.3750 g | Freq: Once | INTRAVENOUS | Status: AC
  Administered 2015-09-27: 3.375 g via INTRAVENOUS
  Filled 2015-09-27: qty 50

## 2015-09-27 MED ORDER — SODIUM CHLORIDE 0.9 % IV SOLN
INTRAVENOUS | Status: DC
  Administered 2015-09-27 – 2015-10-02 (×2): via INTRAVENOUS

## 2015-09-27 MED ORDER — SODIUM CHLORIDE 0.9 % IV SOLN
INTRAVENOUS | Status: DC
  Administered 2015-09-27: 20:00:00 via INTRAVENOUS
  Administered 2015-09-27 (×2): 150 mL/h via INTRAVENOUS

## 2015-09-27 MED ORDER — INSULIN ASPART 100 UNIT/ML IV SOLN
10.0000 [IU] | Freq: Once | INTRAVENOUS | Status: AC
  Administered 2015-09-27: 10 [IU] via INTRAVENOUS
  Filled 2015-09-27: qty 1

## 2015-09-27 MED ORDER — SODIUM CHLORIDE 0.9 % IV BOLUS (SEPSIS)
500.0000 mL | INTRAVENOUS | Status: AC
  Administered 2015-09-27: 500 mL via INTRAVENOUS

## 2015-09-27 MED ORDER — CALCIUM CHLORIDE 10 % IV SOLN
1.0000 g | Freq: Once | INTRAVENOUS | Status: AC
  Administered 2015-09-27: 1 g via INTRAVENOUS

## 2015-09-27 MED ORDER — HEPARIN SODIUM (PORCINE) 5000 UNIT/ML IJ SOLN
5000.0000 [IU] | Freq: Three times a day (TID) | INTRAMUSCULAR | Status: DC
  Administered 2015-09-27 – 2015-09-28 (×2): 5000 [IU] via SUBCUTANEOUS
  Filled 2015-09-27 (×2): qty 1

## 2015-09-27 MED ORDER — DEXTROSE-NACL 5-0.45 % IV SOLN
INTRAVENOUS | Status: DC
  Administered 2015-09-28 (×2): via INTRAVENOUS

## 2015-09-27 MED ORDER — HEPARIN SODIUM (PORCINE) 5000 UNIT/ML IJ SOLN: 60.0000 [IU]/kg | INTRAMUSCULAR | Status: DC

## 2015-09-27 MED ORDER — ALBUTEROL SULFATE (2.5 MG/3ML) 0.083% IN NEBU
10.0000 mg | INHALATION_SOLUTION | Freq: Once | RESPIRATORY_TRACT | Status: AC
  Administered 2015-09-27: 10 mg via RESPIRATORY_TRACT
  Filled 2015-09-27: qty 12

## 2015-09-27 MED ORDER — HEPARIN SODIUM (PORCINE) 5000 UNIT/ML IJ SOLN
INTRAMUSCULAR | Status: AC
  Filled 2015-09-27: qty 1

## 2015-09-27 NOTE — Consult Note (Signed)
CARDIOLOGY CONSULT NOTE   Patient ID: [name] MRN: [id number] DOB/AGE: Oct 05, 1940 75 y.o.  Admit date: 10/10/2015  Primary Physician   [name], MD Primary Cardiologist   New Reason for Consultation  Abnormal EKG ? STEMI Requesting Physician Dr. Anitra Lauth  HPI: [name] is a 75 y.o. male with a history of  Diabetes, proliferative diabetic retinopathy and stroke with residual right-sided weakness presented to [facility] ED by EMS 10/12/2015 for evaluation of shortness of breath.   He was found to have DKA, severe hyperkalemia, bradycardia and + troponin levels.   We were asked to comment on his + Troponin levels and ECG    (Bilateral subcortical infarct - 2008) - Work up at that time - 2D echocardiogram showed an ejection fraction of 60-65% with mild aortic valve calcification. Carotid Doppler showed left internal carotid artery stenosis at 60-79% and right stenosis at 40-59%. 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  No family at bedside. The patient only answers yes or no questions. Most of the history obtained from chart.  No prior cardiac history per patient. The patient with a history of flulike illness, diarrhea, nausea and abdominal pain for the past several days. The EMS called today because of worsening shortness of breath.  EMS found him to be tachypneic , possible syncope episode for a few second and bradycardic at rate of 30 bpm --> given 1 g of atropine with improvement of rate of 70s.  He again become bradycardic in route to ED. The patient denies chest pain, palpitations, shortness of breath, orthopnea, PND, dizziness, syncope,, abdominal pain, hematochezia, melena, lower extremity edema.  In ED, EKG is concerning for STEMI with ST elevation in lead 3 and aVF with depression in lateral lead.Cancelled  code STEMI . Repeat EKG concerning for second-degree AV block, type I. Rhythm strip also concerning for A. fib. POC trop of 5.52. Bedside ultrasound showed no aortic dilation. Creatinine of 3.2 and potassium of 6.6 --> given calcium gluconate and beta agonist. Given 2 L of fluid for lactic acidosis of greater than 7.  Chest x-ray without acute abnormality. Blood glucose of 532. BP of 93/60.   Now BP of 118/72. Code sepsis placed.   Past Medical History  Diagnosis Date  . Diabetes mellitus without complication (HCC)   . Stroke [facility])      No past surgical history on file.  No Known Allergies  I have reviewed the patient's current medications . heparin      . sodium bicarbonate  50 mEq Intravenous Once   . sodium chloride 20 mL/hr at 10/24/2015 1532  . piperacillin-tazobactam    . sodium chloride     Followed by  . sodium chloride       Prior to Admission medications   Not on File     Social History   Social History  . Marital Status: Married    Spouse Name: N/A  . Number of Children: N/A  . Years of Education: N/A   Occupational History  . Not on file.   Social History Main Topics  . Smoking status: Never Smoker   . Smokeless tobacco: Not on file  . Alcohol Use: No  . Drug Use: Not on file  . Sexual Activity: Not on file   Other Topics Concern  . Not on file   Social History Narrative  . No narrative  on file    Family history  - Patient denies any family history of cardiac disease. No family at bedside during interview. Patient's only answers yes or no question.   ROS:  Full 14 point review of systems complete and found to be negative unless listed above.  Physical Exam: Blood pressure 118/72, pulse 49, resp. rate 17, weight 250 lb (113.399 kg), SpO2 100 %.  General:Chronically ill-appearing male in mild  Distress - nonrebreather mask on placed Head: Eyes PERRLA, No xanthomas. Normocephalic and atraumatic, oropharynx without edema or exudate.  Lungs: Resp  regular and unlabored. Diminished breath sounds anteriorly  Heart: RRR no s3, s4, or murmurs..   Neck: No carotid bruits. No lymphadenopathy. No  JVD. Abdomen: Bowel sounds present, abdomen soft and non-tender  Msk:   No weakness, no joint deformities or effusions. Extremities: Motted skin with cold BL LE extremities. Diminished distal pulse.  Neuro: Alert and oriented X 3. No focal deficits noted. Psych:  flat Skin: cold lower extremity with pale face.  Labs:   Lab Results  Component Value Date   WBC 7.7 2015-10-21   HGB 12.2* 2015-10-21   HCT 36.0* 21-Oct-2015   MCV 97.7 October 21, 2015   PLT 158 2015-10-21    Recent Labs  Oct 21, 2015 1329  INR 1.37    Recent Labs Lab Oct 21, 2015 1520  NA 134*  K 6.6*  CL 105  BUN 43*  CREATININE 3.20*  GLUCOSE 532*    Recent Labs  10/21/15 1518  TROPIPOC 5.52*   PRO B NATRIURETIC PEPTIDE (BNP)  Date/Time Value Ref Range Status  05/27/2007 06:30 AM <30.0  Final    Echo: 05/08/2007 LEFT VENTRICLE: - Left ventricular ejection fraction was estimated to be 60 %.  AORTIC VALVE: - The aortic valve was trileaflet.  Doppler interpretation(s): - There was no evidence for aortic valve stenosis. - There was no significant aortic valvular regurgitation.  AORTA: - There was moderate atheroma of the descending aorta.  MITRAL VALVE: Doppler interpretation(s): - There was trivial mitral valvular regurgitation.  LEFT ATRIUM: - Borderline atrial septal aneurysm. - Early positive bubble study.  - There was a probable patent foramen ovale.  RIGHT VENTRICLE: - Right ventricular size was normal. - Right ventricular systolic function was normal. - Right ventricular wall thickness was normal.  TRICUSPID VALVE: Doppler interpretation(s): - There was no significant tricuspid valvular regurgitation.  ---------------------------------------------------------------  SUMMARY - Left ventricular  ejection fraction was estimated to be 60 %. There was moderate atheroma of the descending aorta. - Borderline atrial septal aneurysm. Early positive bubble study.  ECG:  Vent. rate 79 BPM PR interval 61 ms QRS duration 120 ms QT/QTc 399/457 ms P-R-T axes 0 -57 118  Radiology:  Dg Chest Port 1 View  21-Oct-2015  CLINICAL DATA:  Dyspnea, abdominal pain, body aches EXAM: PORTABLE CHEST 1 VIEW COMPARISON:  04/22/2015 FINDINGS: Cardiomediastinal silhouette is stable. No acute infiltrate or pleural effusion. No pulmonary edema. Bony thorax is unremarkable. IMPRESSION: No active disease. Electronically Signed   By: Natasha Mead M.D.   On: 10-21-15 15:29    ASSESSMENT AND PLAN:    The patient was able to speak when I examined him. He denies any recent CP. Just progressive weakness.     1.  Diabetic ketoacidosis:  Vs. Sepsis with lactic acidosis with marked hyperglycemia.   Complicated by marked hyperkalemia, acute on chronic renal insufficiency, It is not unusual to have profound conduction abnormalities in the setting of hyperkalemia and DKA.  No indication for  pacing  - the ECG shows Mobitz I AVB.   Plans per PCCM    2. Elevated troponin - POC troponin of 5.53 in setting of  Severe acodosis / DKA.   In this setting , he is likely hypoperfusion of his whole body - including his heart .   Agree with echo .  The T wave changes may be due to hyperkalemia.   Will reassess the ECG after his electrolyte abnormalities have been corrected.   3. 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 Recommend outpatient f/u study. Unknown history.    5. Hx of stroke 2008    Signed: CrescentBhagat,Bhavinkumar, GeorgiaPA 10/22/2015, 4:10 PM Pager [phone]  Co-Sign MD  Attending Note:   The patient was seen and examined.  Agree with assessment and plan as noted above.  Extensive Changes made to the above note as needed including the entire A/P.     Vesta MixerPhilip  J. Seiya Silsby, Montez HagemanJr., MD, [facility] HospitalFACC 09/26/2015, 5:29 PM [address],  [address] Office [phone] Pager [phone]

## 2015-09-27 NOTE — H&P (Signed)
PULMONARY / CRITICAL CARE MEDICINE   Name: [name] MRN: [id number] DOB: September 04, 1941    ADMISSION DATE:  10/16/15   CONSULTATION DATE:  October 16, 2015  REFERRING MD:  ED  CHIEF COMPLAINT:  Shortness of breath  HISTORY OF PRESENT ILLNESS:   This is a 75 yo white male with a h/o Type 2 DM and CVA  With residual right-sided weakness who presents via EMS with SOB, syncope and bradycardia. History is obtained from ED records as patient does not recall what brought him to the hospital. EMS was called for acute onset shortness of breath. Patient had flulike symptoms, abdominal pain, nausea, diarrhea and generalized malaise for several days. Last night patient became SOB. This afternoon, symptoms got worse and EMS was called. U[pon EMS arrival, patient was found to be tachypneic and had a brief syncopal episode and bradycardia with HR in the 30s as he was being transferred to the hospital. He was given 1 mg atropine and HR improved to the 70s. Enroute to the ED, he had another episode of bradycardia but HR improved.  His blood glucose in the field was in the 500s. Patient is currently alert. He denies pain, sob, chest pain, nausea and vomiting.  In the ED, patient was hypotensive, severely hyperglycemic,  his troponin was elevated and he had some EKG changes.    PAST MEDICAL HISTORY :  He  has a past medical history of Diabetes mellitus without complication (HCC) and Stroke (HCC).  PAST SURGICAL HISTORY: He  has no past surgical history on file.  No Known Allergies  No current facility-administered medications on file prior to encounter.   No current outpatient prescriptions on file prior to encounter.    FAMILY HISTORY:  His has no family status information on file.   SOCIAL HISTORY: He  reports that he has never smoked. He does not have any smokeless tobacco history on file. He reports that he does not drink alcohol.  REVIEW OF SYSTEMS:   Constitutional: Negative for fever and  chills.  HENT: Negative for congestion and rhinorrhea.  Eyes: Negative for redness and visual disturbance.  Respiratory: Negative for shortness of breath and wheezing.  Cardiovascular: Negative for chest pain and palpitations.  Gastrointestinal: Negative for nausea and vomiting Genitourinary: Negative for dysuria and urgency.  Musculoskeletal: Negative for myalgias and arthralgias.  Skin: Negative for pallor and wound.  Neurological: Negative for dizziness and headaches.   SUBJECTIVE:  Awake and confused  VITAL SIGNS: BP 117/69 mmHg  Pulse 96  Resp 17  Wt 113.399 kg (250 lb)  SpO2 84%  HEMODYNAMICS:    VENTILATOR SETTINGS:    INTAKE / OUTPUT:    PHYSICAL EXAMINATION: General:  Acutely ill Neuro:  AAO X 1, Moves all extremities, no focal deficits HEENT: PERRLA, trachea midline Cardiovascular: RRR, S1/S2, no MRG; trace edema, venous stasis discoloration Lungs: Unlabored, CTAB Abdomen: Obese, soft, NT, normal bowel sounds Musculoskeletal: No joint deformities Skin: Extremities mottled, faint pulses, skin is cold and clammy  LABS:  BMET  Recent Labs Lab 2015/10/16 1329 2015/10/16 1520  NA 135 134*  K 7.0* 6.6*  CL 102 105  CO2 15*  --   BUN 44* 43*  CREATININE 3.89* 3.20*  GLUCOSE 586* 532*    Electrolytes  Recent Labs Lab 10-16-15 1329  CALCIUM 8.4*    CBC  Recent Labs Lab 10/16/2015 1329 10/16/2015 1520  WBC 7.7  --   HGB 11.2* 12.2*  HCT 34.4* 36.0*  PLT 158  --  Coag's  Recent Labs Lab Aug 26, 2016 1329  APTT 30  INR 1.37    Sepsis Markers  Recent Labs Lab Aug 26, 2016 1532  LATICACIDVEN 7.37*    ABG No results for input(s): PHART, PCO2ART, PO2ART in the last 168 hours.  Liver Enzymes  Recent Labs Lab Aug 26, 2016 1329  AST 111*  ALT 89*  ALKPHOS 52  BILITOT 3.0*  ALBUMIN 3.2*    Cardiac Enzymes No results for input(s): TROPONINI, PROBNP in the last 168 hours.  Glucose  Recent Labs Lab Aug 26, 2016 1504  GLUCAP 430*     Imaging Dg Chest Port 1 View  10/16/2015  CLINICAL DATA:  Dyspnea, abdominal pain, body aches EXAM: PORTABLE CHEST 1 VIEW COMPARISON:  04/22/2015 FINDINGS: Cardiomediastinal silhouette is stable. No acute infiltrate or pleural effusion. No pulmonary edema. Bony thorax is unremarkable. IMPRESSION: No active disease. Electronically Signed   By: Natasha MeadLiviu  Pop M.D.   On: 30-Sep-2015 15:29   STUDIES:  01/03 CT abdomen and pelvis>>   CULTURES: 01/03 Blood and  urine 01/03>>  ANTIBIOTICS: Zosyn 01/03>>  SIGNIFICANT EVENTS: 01/03>>ED with SOB, bradycardia, DKA  And elevated troponin  LINES/TUBES: PIVs  DISCUSSION: 75 yo male presenting with DKA, ARF,  transient syncopal episodes secondary to bradycardia and elevated troponin  ASSESSMENT / PLAN:  PULMONARY A: No acute issues P:   Supplemental O2 to keep O2 saturation> 90% BiPAP prn  CARDIOVASCULAR A:  Bradycardia Elevated troponin P:  Cardiology following, appreciate recs EKG Cycle troponin Heparin for DVT prophylaxis  RENAL A:   Acute renal failure secondary to volume depletion Hyperkalemia P:   IV fluids BMP Q6H Foley Strict I/O Treat hyperkalemia given EKG changes  GASTROINTESTINAL A:   Nausea and diarrhea(per patient's wife) P:   CT abdomen pending NPO  HEMATOLOGIC A:   No acute issues P:  Monitor CBC  INFECTIOUS A:   R/o sepsis P:   F/U cultures Empiric antibiotics  ENDOCRINE A:   DKA Type 2 DM P:   Blood glucose monitoring, nsulin gtt and fluid resuscitation per DKA protocol Diabetes education consult  NEUROLOGIC A:   Metabolic encephalopathy H/O CVA P:   RASS goal: Neuro checks ASA   FAMILY  - Updates: No family at bedside  - Inter-disciplinary family meet or Palliative Care meeting due by:  day 7    Pulmonary and Critical Care Medicine [facility] Pager: [phone]  09/25/2015, 4:52 PM

## 2015-09-27 NOTE — ED Notes (Signed)
Activated CODE STEMI  

## 2015-09-27 NOTE — Progress Notes (Signed)
CRITICAL VALUE ALERT  Critical value received:  Lactic Acid 6.9  Date of notification:  10/01/2015  Time of notification:  2336  Critical value read back: yes  Nurse who received alert:  Marco Collieharis Kamari Bilek RN  MD notified (1st page):  Byrum  Time of first page:  2338  Responding MD:  Delton CoombesByrum  Time MD responded:  2338

## 2015-09-27 NOTE — Progress Notes (Signed)
eLink Physician-Brief Progress Note Patient Name: Lockie Pareslfred K Morgenthaler DOB: Jan 23, 1941 MRN: [id number]   Date of Service  09/26/2015  HPI/Events of Note  Lactic acidosis.   eICU Interventions  trending LA q6hr x2     Intervention Category Intermediate Interventions: Other:  Lawanda CousinsJennings Cadee Agro 10/13/2015, 7:36 PM

## 2015-09-27 NOTE — Progress Notes (Signed)
ANTIBIOTIC CONSULT NOTE - INITIAL  Pharmacy Consult for Zosyn Indication: intra-abdominal infection  No Known Allergies  Patient Measurements: Weight: 250 lb (113.399 kg) Adjusted Body Weight:   Vital Signs: BP: 118/72 mmHg (01/03 1545) Pulse Rate: 49 (01/03 1545) Intake/Output from previous day:   Intake/Output from this shift:    Labs:  Recent Labs  10/08/2015 1329 10/10/2015 1520  WBC 7.7  --   HGB 11.2* 12.2*  PLT 158  --   CREATININE  --  3.20*   CrCl cannot be calculated (Unknown ideal weight.). No results for input(s): VANCOTROUGH, VANCOPEAK, VANCORANDOM, GENTTROUGH, GENTPEAK, GENTRANDOM, TOBRATROUGH, TOBRAPEAK, TOBRARND, AMIKACINPEAK, AMIKACINTROU, AMIKACIN in the last 72 hours.   Microbiology: No results found for this or any previous visit (from the past 720 hour(s)).  Medical History: Past Medical History  Diagnosis Date  . Diabetes mellitus without complication (HCC)   . Stroke [facility](HCC)     Medications:   (Not in a hospital admission) Scheduled:  . albuterol  10 mg Nebulization Once  . heparin      . sodium bicarbonate  50 mEq Intravenous Once   Infusions:  . sodium chloride 20 mL/hr at 09/26/2015 1532  . piperacillin-tazobactam    . sodium chloride     Followed by  . sodium chloride     Assessment: 75yo male with history of stroke and DM2 presents with SOB. Pharmacy is consulted to dose zosyn for suspected intra-abdominal infection. WBC 7.7, sCr 3.2, LA 7.37.  Goal of Therapy:  Eradication of infection  Plan:  Zosyn 3.375g IV q8h Follow up culture results, renal function and clinical course  Arlean HoppingCorey M. Newman PiesBall, PharmD, BCPS Clinical Pharmacist Pager [phone] 10/16/2015,4:06 PM

## 2015-09-27 NOTE — ED Provider Notes (Signed)
CSN: [id number]     Arrival date & time 10/20/2015  1449 History   None    Chief Complaint  Patient presents with  . Shortness of Breath   75 her Caucasian male with PMH of blindness, DM 2 on Lantus, and stroke with residual right-sided weakness who presents to ED by EMS for acute onset of shortness of breath. Wife endorses that patient has had a flulike illness for several days with general malaise, abdominal pain, nausea, and a few episodes of diarrhea. He began complaining of shortness of breath last night but did not want to call out for help. All day today patient was fatigued and had shortness of breath and finally wife convinced husband to call for help this afternoon. EMS arrived and found patient to be tachypneic. He went to stand him up and he syncopized for a few seconds. He was then found to be bradycardic in the 30s and given 1 of atropine which made patient felt much better and heart rate went to the 70s. Blood sugar was found to be in the 500s. Wife gave him dose of Lantus (42) prior to leaving home. In route patient became bradycardic again.  Patient denies chest pain, diaphoresis, abdominal pain, flank pain, hematuria, dysuria, or  recent travel. Did not receive flu shot this year. Sick contacts include family members at home with viral illness.  (Consider location/radiation/quality/duration/timing/severity/associated sxs/prior Treatment) Patient is a 75 y.o. male presenting with general illness.  Illness Location:  Diarrhea and fatigue and sob Severity:  Moderate Onset quality:  Sudden Timing:  Constant Progression:  Worsening Chronicity:  New Associated symptoms: diarrhea, fatigue and shortness of breath   Associated symptoms: no abdominal pain, no chest pain, no fever, no headaches, no nausea and no vomiting     Past Medical History  Diagnosis Date  . Diabetes mellitus without complication (HCC)   . Stroke [facility])    No past surgical history on file. No family history on  file. Social History  Substance Use Topics  . Smoking status: Never Smoker   . Smokeless tobacco: None  . Alcohol Use: No    Review of Systems  Constitutional: Positive for fatigue. Negative for fever and chills.  Respiratory: Positive for shortness of breath.   Cardiovascular: Negative for chest pain, palpitations and leg swelling.  Gastrointestinal: Positive for diarrhea. Negative for nausea, vomiting, abdominal pain, constipation and abdominal distention.  Genitourinary: Negative for dysuria, frequency, flank pain and decreased urine volume.  Neurological: Negative for dizziness, speech difficulty, light-headedness and headaches.  All other systems reviewed and are negative.     Allergies  Review of patient's allergies indicates no known allergies.  Home Medications   Prior to Admission medications   Medication Sig Start Date End Date Taking? Authorizing Provider  glimepiride (AMARYL) 4 MG tablet Take 4 mg by mouth daily with breakfast.   Yes Historical Provider, MD  insulin glargine (LANTUS) 100 UNIT/ML injection Inject 64 Units into the skin daily.   Yes Historical Provider, MD  lisinopril-hydrochlorothiazide (PRINZIDE,ZESTORETIC) 20-12.5 MG tablet Take 1 tablet by mouth daily.   Yes Historical Provider, MD  metFORMIN (GLUCOPHAGE-XR) 750 MG 24 hr tablet Take 750 mg by mouth every other day.   Yes Historical Provider, MD  tamsulosin (FLOMAX) 0.4 MG CAPS capsule Take 0.4 mg by mouth 2 (two) times daily.   Yes Historical Provider, MD  terbinafine (LAMISIL) 250 MG tablet Take 250 mg by mouth daily.   Yes Historical Provider, MD   BP 101/72  mmHg  Pulse 94  Temp(Src) 97.4 F (36.3 C) (Axillary)  Resp 18  Ht 6\' 1"  (1.854 m)  Wt 92.2 kg  BMI 26.82 kg/m2  SpO2 97% Physical Exam  Constitutional: He is oriented to person, place, and time. He appears well-developed and well-nourished. No distress.  HENT:  Head: Normocephalic and atraumatic.  Eyes: Pupils are equal, round, and  reactive to light.  Neck: Normal range of motion.  Cardiovascular: Regular rhythm, normal heart sounds and intact distal pulses.  Exam reveals no gallop and no friction rub.   No murmur heard. [facility] extremities, pulses difficult to obtain by feel or doppler in feet.  Pulmonary/Chest: Effort normal and breath sounds normal. No respiratory distress. He has no wheezes. He has no rales. He exhibits no tenderness.  Abdominal: Soft. Bowel sounds are normal. He exhibits no distension and no mass. There is no tenderness. There is no rebound and no guarding.  Musculoskeletal: Normal range of motion.  Lymphadenopathy:    He has no cervical adenopathy.  Neurological: He is alert and oriented to person, place, and time. No cranial nerve deficit. Coordination normal.  Skin: Skin is warm and dry. He is not diaphoretic.  Nursing note and vitals reviewed.   ED Course  .Critical Care Performed by: Rachelle Hora Authorized by: Rachelle Hora Total critical care time: 45 minutes Critical care time was exclusive of separately billable procedures and treating other patients. Critical care was necessary to treat or prevent imminent or life-threatening deterioration of the following conditions: circulatory failure, dehydration and renal failure. Critical care was time spent personally by me on the following activities: blood draw for specimens, obtaining history from patient or surrogate, pulse oximetry, development of treatment plan with patient or surrogate, ordering and performing treatments and interventions, re-evaluation of patient's condition, discussions with consultants, evaluation of patient's response to treatment, ordering and review of laboratory studies, ordering and review of radiographic studies and examination of patient. Subsequent provider of critical care: I assumed direction of critical care for this patient from another provider of my specialty.   (including critical care time) Labs  Review Labs Reviewed  CBC - Abnormal; Notable for the following:    RBC 3.52 (*)    Hemoglobin 11.2 (*)    HCT 34.4 (*)    All other components within normal limits  COMPREHENSIVE METABOLIC PANEL - Abnormal; Notable for the following:    Potassium 7.0 (*)    CO2 15 (*)    Glucose, Bld 586 (*)    BUN 44 (*)    Creatinine, Ser 3.89 (*)    Calcium 8.4 (*)    Total Protein 6.0 (*)    Albumin 3.2 (*)    AST 111 (*)    ALT 89 (*)    Total Bilirubin 3.0 (*)    GFR calc non Af Amer 14 (*)    GFR calc Af Amer 16 (*)    Anion gap 18 (*)    All other components within normal limits  PROTIME-INR - Abnormal; Notable for the following:    Prothrombin Time 17.0 (*)    All other components within normal limits  BASIC METABOLIC PANEL - Abnormal; Notable for the following:    Potassium 6.4 (*)    CO2 16 (*)    Glucose, Bld 514 (*)    BUN 48 (*)    Creatinine, Ser 3.09 (*)    GFR calc non Af Amer 18 (*)    GFR calc Af Amer 21 (*)  Anion gap 16 (*)    All other components within normal limits  BASIC METABOLIC PANEL - Abnormal; Notable for the following:    CO2 15 (*)    Glucose, Bld 507 (*)    BUN 44 (*)    Creatinine, Ser 3.64 (*)    GFR calc non Af Amer 15 (*)    GFR calc Af Amer 17 (*)    Anion gap 20 (*)    All other components within normal limits  CBC - Abnormal; Notable for the following:    RBC 3.50 (*)    Hemoglobin 11.3 (*)    HCT 33.7 (*)    Platelets 124 (*)    All other components within normal limits  BETA-HYDROXYBUTYRIC ACID - Abnormal; Notable for the following:    Beta-Hydroxybutyric Acid 1.78 (*)    All other components within normal limits  GLUCOSE, CAPILLARY - Abnormal; Notable for the following:    Glucose-Capillary 444 (*)    All other components within normal limits  LACTIC ACID, PLASMA - Abnormal; Notable for the following:    Lactic Acid, Venous 6.9 (*)    All other components within normal limits  I-STAT TROPOININ, ED - Abnormal; Notable for the  following:    Troponin i, poc 5.52 (*)    All other components within normal limits  I-STAT CHEM 8, ED - Abnormal; Notable for the following:    Sodium 134 (*)    Potassium 6.6 (*)    BUN 43 (*)    Creatinine, Ser 3.20 (*)    Glucose, Bld 532 (*)    Calcium, Ion 0.99 (*)    Hemoglobin 12.2 (*)    HCT 36.0 (*)    All other components within normal limits  I-STAT CG4 LACTIC ACID, ED - Abnormal; Notable for the following:    Lactic Acid, Venous 7.37 (*)    All other components within normal limits  I-STAT CG4 LACTIC ACID, ED - Abnormal; Notable for the following:    Lactic Acid, Venous 7.04 (*)    All other components within normal limits  I-STAT CHEM 8, ED - Abnormal; Notable for the following:    Potassium 6.2 (*)    BUN 56 (*)    Creatinine, Ser 3.00 (*)    Glucose, Bld 508 (*)    Hemoglobin 11.9 (*)    HCT 35.0 (*)    All other components within normal limits  CBG MONITORING, ED - Abnormal; Notable for the following:    Glucose-Capillary 430 (*)    All other components within normal limits  MRSA PCR SCREENING  CULTURE, BLOOD (ROUTINE X 2)  CULTURE, BLOOD (ROUTINE X 2)  URINE CULTURE  APTT  DIFFERENTIAL  TSH  MAGNESIUM  PHOSPHORUS  PROCALCITONIN  URINALYSIS, ROUTINE W REFLEX MICROSCOPIC (NOT AT [facility])  BASIC METABOLIC PANEL  BASIC METABOLIC PANEL  CBC  MAGNESIUM  PHOSPHORUS  PROCALCITONIN  LACTIC ACID, PLASMA  BASIC METABOLIC PANEL  I-STAT TROPOININ, ED  I-STAT CG4 LACTIC ACID, ED  I-STAT CG4 LACTIC ACID, ED    Imaging Review Ct Abdomen Pelvis Wo Contrast  09/29/2015  CLINICAL DATA:  Short of breath. sepsis. Elevated blood sugar. Diabetes. EXAM: CT CHEST, ABDOMEN AND PELVIS WITHOUT CONTRAST TECHNIQUE: Multidetector CT imaging of the chest, abdomen and pelvis was performed following the standard protocol without IV contrast. COMPARISON:  Plain film chest earlier today.  No prior CTs. FINDINGS: CT CHEST Mediastinum/Nodes: Aortic and branch vessel atherosclerosis.  Tortuous descending thoracic aorta. Mild cardiomegaly. Multivessel  coronary artery atherosclerosis. Air within the right atrium and jugular veins is likely iatrogenic. No mediastinal or definite hilar adenopathy, given limitations of unenhanced CT. Lungs/Pleura: Small bilateral pleural effusions. Multifactorial mild to moderate degradation. Motion and patient arm position (not raised above the head). Bibasilar dependent airspace disease. Musculoskeletal: No acute osseous abnormality. CT ABDOMEN AND PELVIS Hepatobiliary: Position and motion degradation continues into the upper abdomen. Grossly normal noncontrast appearance of the liver. The gallbladder is hyper attenuating, but suboptimally evaluated. No biliary duct dilatation. Pancreas: Mild to moderate pancreatic atrophy, without duct dilatation or surrounding inflammation. Spleen: Normal in size, without focal abnormality. Adrenals/Urinary Tract: Normal adrenal glands. Mild bilateral renal cortical thinning. No renal calculi or hydronephrosis. No hydroureter or ureteric calculi. No bladder calculi. Stomach/Bowel: Stomach is collapsed. Extensive colonic diverticulosis. Mild sigmoid wall thickening is likely due to muscular hypertrophy. Normal terminal ileum and appendix. Normal small bowel. Vascular/Lymphatic: Aortic and branch vessel atherosclerosis. No abdominopelvic adenopathy. Reproductive: Normal prostate. Other: No significant free fluid. Musculoskeletal: Lumbosacral spondylosis. IMPRESSION: 1. Degraded exam, secondary to motion, lack of IV contrast, and patient arm position, not raised above the head. 2. Small bilateral pleural effusions with adjacent bibasilar atelectasis. 3. Hepatic steatosis. 4. Hyper attenuation within the gallbladder, suboptimally evaluated. This could represent stones or sludge. If the patient has abdominal symptoms, consider ultrasound. 5. Atherosclerosis, including within the coronary arteries. Electronically Signed   By: Jeronimo Greaves M.D.   On: 10/10/2015 18:21   Ct Chest Wo Contrast  2015-10-10  CLINICAL DATA:  Short of breath. sepsis. Elevated blood sugar. Diabetes. EXAM: CT CHEST, ABDOMEN AND PELVIS WITHOUT CONTRAST TECHNIQUE: Multidetector CT imaging of the chest, abdomen and pelvis was performed following the standard protocol without IV contrast. COMPARISON:  Plain film chest earlier today.  No prior CTs. FINDINGS: CT CHEST Mediastinum/Nodes: Aortic and branch vessel atherosclerosis. Tortuous descending thoracic aorta. Mild cardiomegaly. Multivessel coronary artery atherosclerosis. Air within the right atrium and jugular veins is likely iatrogenic. No mediastinal or definite hilar adenopathy, given limitations of unenhanced CT. Lungs/Pleura: Small bilateral pleural effusions. Multifactorial mild to moderate degradation. Motion and patient arm position (not raised above the head). Bibasilar dependent airspace disease. Musculoskeletal: No acute osseous abnormality. CT ABDOMEN AND PELVIS Hepatobiliary: Position and motion degradation continues into the upper abdomen. Grossly normal noncontrast appearance of the liver. The gallbladder is hyper attenuating, but suboptimally evaluated. No biliary duct dilatation. Pancreas: Mild to moderate pancreatic atrophy, without duct dilatation or surrounding inflammation. Spleen: Normal in size, without focal abnormality. Adrenals/Urinary Tract: Normal adrenal glands. Mild bilateral renal cortical thinning. No renal calculi or hydronephrosis. No hydroureter or ureteric calculi. No bladder calculi. Stomach/Bowel: Stomach is collapsed. Extensive colonic diverticulosis. Mild sigmoid wall thickening is likely due to muscular hypertrophy. Normal terminal ileum and appendix. Normal small bowel. Vascular/Lymphatic: Aortic and branch vessel atherosclerosis. No abdominopelvic adenopathy. Reproductive: Normal prostate. Other: No significant free fluid. Musculoskeletal: Lumbosacral spondylosis. IMPRESSION:  1. Degraded exam, secondary to motion, lack of IV contrast, and patient arm position, not raised above the head. 2. Small bilateral pleural effusions with adjacent bibasilar atelectasis. 3. Hepatic steatosis. 4. Hyper attenuation within the gallbladder, suboptimally evaluated. This could represent stones or sludge. If the patient has abdominal symptoms, consider ultrasound. 5. Atherosclerosis, including within the coronary arteries. Electronically Signed   By: Jeronimo Greaves M.D.   On: 10/10/15 18:21   Dg Chest Port 1 View  10-10-15  CLINICAL DATA:  Dyspnea, abdominal pain, body aches EXAM: PORTABLE CHEST 1 VIEW COMPARISON:  04/22/2015  FINDINGS: Cardiomediastinal silhouette is stable. No acute infiltrate or pleural effusion. No pulmonary edema. Bony thorax is unremarkable. IMPRESSION: No active disease. Electronically Signed   By: Natasha Mead M.D.   On: 10/24/2015 15:29   I have personally reviewed and evaluated these images and lab results as part of my medical decision-making.   EKG Interpretation   Date/Time:  Tuesday September 27 2015 14:53:19 EST Ventricular Rate:  55 PR Interval:  176 QRS Duration: 129 QT Interval:  487 QTC Calculation: 466 R Axis:   -83 Text Interpretation:  Sinus rhythm Atrial premature complexes IVCD,  consider atypical RBBB inferior STEMI Confirmed by GOLDSTON  MD, SCOTT  ([id number]) on 10/01/2015 3:47:03 PM      MDM   Final diagnoses:  SOB (shortness of breath)  AKI (acute kidney injury) (HCC)  Lactic acid acidosis  Dehydration  Bradycardia    75 year old male here with shortness of breath. EKG concerning for possible STEMI: ST elevation in III and avF  W/depression in lateral leads. Code Stemi called, ASA 324mg  given. He is bradycardic in the 40s but pt mentating well and BP in the 90s systolics. Patient is satting in the mid 80s, placed on nonrebreather with improvement low 90s. Chest x-ray with no sign of fluid overload or infiltrate. Patient does appear mottled  and feet are cool to touch, likely due to bradycardia and the cardiac output. Not consistent with AAA as bedside ultrasound shows no aortic dilatation.   Chem-8 shows AKI with creatinine at 3.5 and  potassium of 6.6. Calcium gluconate, insulin, beta agonist. He is hyperglycemic at 430s therefore glucose held.  Cardiology feel this is likely not a STEMI, therefore holding heparin.  Lactic acid >7.5. Code sepsis called, Given 30cc/kg fluids and Zosyn (abdominal infection coverage). Repeat lactic acid after 2 hours 7.0.  Consulted intensivist and admitted to ICU. Presuming severe dehydration and kidney injury 2/2 diarrheal illness.    Pt was seen under the supervision of Dr. Anitra Lauth.     Rachelle Hora, MD 09/28/15 1610  Gwyneth Sprout, MD 09/28/15 2052

## 2015-09-27 NOTE — ED Provider Notes (Signed)
Patient arrives to ED critically ill by EMS. Initially seen by myself and then full workup obtained by Dr. Smith/Dr. Anitra LauthPlunkett. Patient bradycardic in low 60s/high 50s with BP in 90s but overall appears very ill. Poor perfusion. He currently has no complaints, had acute dyspnea at house but no complaints now. Diarrhea and fatigue x 2-3 days. Initially bradycardic down to 30s per EMS, came up with atropine and he felt better. EKG is very concerning for STEMI but story is atypical. Code STEMI activated. Care to primary team.   EKG Interpretation  Date/Time:  Tuesday September 27 2015 14:53:19 EST Ventricular Rate:  55 PR Interval:  176 QRS Duration: 129 QT Interval:  487 QTC Calculation: 466 R Axis:   -83 Text Interpretation:  Sinus rhythm Atrial premature complexes IVCD, consider atypical RBBB inferior STEMI Confirmed by Alycia Cooperwood  [name], Ryan Ogborn ([id number]) on 10/12/2015 3:47:03 PM        Jacob LovelessScott Afrah Burlison, [name] 09/26/2015 [phone]

## 2015-09-27 NOTE — ED Notes (Signed)
Pt here from home , called out today for sob , EMS stated that pt was brady in the 30's and received 1/2 amp of atropine , pt also with a cbg of high and hypotensive systolic in the 80's

## 2015-09-27 NOTE — ED Notes (Addendum)
MD Plunkett aware of patient's heart rate change down to 49 bpm.

## 2015-09-27 NOTE — ED Notes (Signed)
ACTIVATED CODE SEPSIS  

## 2015-09-27 NOTE — Progress Notes (Signed)
Pharmacy Code Sepsis Protocol  Time of code sepsis page: 1611 Time of antibiotic delivery: 1616  Were antibiotics ordered at the time of the code sepsis page? Yes Was it required to contact the physician? [x]  Physician not contacted []  Physician contacted to order antibiotics for code sepsis []  Physician contacted to recommend changing antibiotics  Pharmacy consulted for: Zosyn  Anti-infectives    Start     Dose/Rate Route Frequency Ordered Stop   08-Jan-2016 1600  piperacillin-tazobactam (ZOSYN) IVPB 3.375 g     3.375 g 100 mL/hr over 30 Minutes Intravenous  Once 08-Jan-2016 1559        @infusions @   Nurse education provided: [x]  Minutes left to administer antibiotics to achieve 1 hour goal [x]  Correct order of antibiotic administration [x]  Antibiotic Y-site compatibilities     Arlean Hoppingorey M. Newman PiesBall, PharmD, BCPS Clinical Pharmacist Pager [phone]  10/22/2015, 4:17 PM

## 2015-09-28 ENCOUNTER — Inpatient Hospital Stay (HOSPITAL_COMMUNITY): Payer: Medicare Other

## 2015-09-28 ENCOUNTER — Ambulatory Visit (HOSPITAL_COMMUNITY): Payer: Medicare Other

## 2015-09-28 LAB — GLUCOSE, CAPILLARY
GLUCOSE-CAPILLARY: 103 mg/dL — AB (ref 65–99)
GLUCOSE-CAPILLARY: 124 mg/dL — AB (ref 65–99)
GLUCOSE-CAPILLARY: 128 mg/dL — AB (ref 65–99)
GLUCOSE-CAPILLARY: 130 mg/dL — AB (ref 65–99)
GLUCOSE-CAPILLARY: 139 mg/dL — AB (ref 65–99)
GLUCOSE-CAPILLARY: 213 mg/dL — AB (ref 65–99)
GLUCOSE-CAPILLARY: 311 mg/dL — AB (ref 65–99)
GLUCOSE-CAPILLARY: 386 mg/dL — AB (ref 65–99)
GLUCOSE-CAPILLARY: 454 mg/dL — AB (ref 65–99)
GLUCOSE-CAPILLARY: 482 mg/dL — AB (ref 65–99)
GLUCOSE-CAPILLARY: 82 mg/dL (ref 65–99)
Glucose-Capillary: 376 mg/dL — ABNORMAL HIGH (ref 65–99)
Glucose-Capillary: 232 mg/dL — ABNORMAL HIGH (ref 65–99)
Glucose-Capillary: 206 mg/dL — ABNORMAL HIGH (ref 65–99)
Glucose-Capillary: 153 mg/dL — ABNORMAL HIGH (ref 65–99)
Glucose-Capillary: 133 mg/dL — ABNORMAL HIGH (ref 65–99)
Glucose-Capillary: 98 mg/dL (ref 65–99)
Glucose-Capillary: 105 mg/dL — ABNORMAL HIGH (ref 65–99)
Glucose-Capillary: 111 mg/dL — ABNORMAL HIGH (ref 65–99)
Glucose-Capillary: 140 mg/dL — ABNORMAL HIGH (ref 65–99)
Glucose-Capillary: 121 mg/dL — ABNORMAL HIGH (ref 65–99)

## 2015-09-28 LAB — BASIC METABOLIC PANEL
ANION GAP: 16 — AB (ref 5–15)
ANION GAP: 14 (ref 5–15)
ANION GAP: 14 (ref 5–15)
ANION GAP: 14 (ref 5–15)
Anion gap: 13 (ref 5–15)
BUN: 47 mg/dL — AB (ref 6–20)
BUN: 50 mg/dL — AB (ref 6–20)
BUN: 51 mg/dL — ABNORMAL HIGH (ref 6–20)
BUN: 54 mg/dL — AB (ref 6–20)
BUN: 58 mg/dL — AB (ref 6–20)
CALCIUM: 9.2 mg/dL (ref 8.9–10.3)
CHLORIDE: 112 mmol/L — AB (ref 101–111)
CHLORIDE: 110 mmol/L (ref 101–111)
CHLORIDE: 111 mmol/L (ref 101–111)
CO2: 17 mmol/L — AB (ref 22–32)
CO2: 20 mmol/L — AB (ref 22–32)
CO2: 21 mmol/L — ABNORMAL LOW (ref 22–32)
CO2: 18 mmol/L — AB (ref 22–32)
CO2: 17 mmol/L — ABNORMAL LOW (ref 22–32)
CREATININE: 2.94 mg/dL — AB (ref 0.61–1.24)
Calcium: 9.1 mg/dL (ref 8.9–10.3)
Calcium: 9.2 mg/dL (ref 8.9–10.3)
Calcium: 9.2 mg/dL (ref 8.9–10.3)
Calcium: 8.9 mg/dL (ref 8.9–10.3)
Chloride: 110 mmol/L (ref 101–111)
Chloride: 111 mmol/L (ref 101–111)
Creatinine, Ser: 3.76 mg/dL — ABNORMAL HIGH (ref 0.61–1.24)
Creatinine, Ser: 3.15 mg/dL — ABNORMAL HIGH (ref 0.61–1.24)
Creatinine, Ser: 3.18 mg/dL — ABNORMAL HIGH (ref 0.61–1.24)
Creatinine, Ser: 2.97 mg/dL — ABNORMAL HIGH (ref 0.61–1.24)
GFR calc Af Amer: 17 mL/min — ABNORMAL LOW (ref >60)
GFR calc Af Amer: 21 mL/min — ABNORMAL LOW (ref >60)
GFR calc Af Amer: 20 mL/min — ABNORMAL LOW (ref >60)
GFR calc Af Amer: 22 mL/min — ABNORMAL LOW (ref >60)
GFR calc non Af Amer: 19 mL/min — ABNORMAL LOW (ref >60)
GFR, EST AFRICAN AMERICAN: 22 mL/min — AB (ref >60)
GFR, EST NON AFRICAN AMERICAN: 14 mL/min — AB (ref >60)
GFR, EST NON AFRICAN AMERICAN: 18 mL/min — AB (ref >60)
GFR, EST NON AFRICAN AMERICAN: 18 mL/min — AB (ref >60)
GFR, EST NON AFRICAN AMERICAN: 19 mL/min — AB (ref >60)
GLUCOSE: 258 mg/dL — AB (ref 65–99)
GLUCOSE: 133 mg/dL — AB (ref 65–99)
GLUCOSE: 118 mg/dL — AB (ref 65–99)
Glucose, Bld: 87 mg/dL (ref 65–99)
Glucose, Bld: 109 mg/dL — ABNORMAL HIGH (ref 65–99)
POTASSIUM: 3.9 mmol/L (ref 3.5–5.1)
POTASSIUM: 3.9 mmol/L (ref 3.5–5.1)
POTASSIUM: 3.7 mmol/L (ref 3.5–5.1)
POTASSIUM: 4.2 mmol/L (ref 3.5–5.1)
Potassium: 3.8 mmol/L (ref 3.5–5.1)
SODIUM: 145 mmol/L (ref 135–145)
SODIUM: 142 mmol/L (ref 135–145)
Sodium: 143 mmol/L (ref 135–145)
Sodium: 145 mmol/L (ref 135–145)
Sodium: 143 mmol/L (ref 135–145)

## 2015-09-28 LAB — MAGNESIUM: MAGNESIUM: 1.8 mg/dL (ref 1.7–2.4)

## 2015-09-28 LAB — LACTIC ACID, PLASMA
LACTIC ACID, VENOUS: 4.4 mmol/L — AB (ref 0.5–2.0)
Lactic Acid, Venous: 3.6 mmol/L (ref 0.5–2.0)

## 2015-09-28 LAB — URINALYSIS, ROUTINE W REFLEX MICROSCOPIC
Glucose, UA: NEGATIVE mg/dL
Ketones, ur: 15 mg/dL — AB
Leukocytes, UA: NEGATIVE
Nitrite: NEGATIVE
PH: 5 (ref 5.0–8.0)
Protein, ur: 30 mg/dL — AB
SPECIFIC GRAVITY, URINE: 1.033 — AB (ref 1.005–1.030)

## 2015-09-28 LAB — URINE MICROSCOPIC-ADD ON: RBC / HPF: NONE SEEN RBC/hpf (ref 0–5)

## 2015-09-28 LAB — CBC
HCT: 32.4 % — ABNORMAL LOW (ref 39.0–52.0)
Hemoglobin: 11.2 g/dL — ABNORMAL LOW (ref 13.0–17.0)
MCH: 32.3 pg (ref 26.0–34.0)
MCHC: 34.6 g/dL (ref 30.0–36.0)
MCV: 93.4 fL (ref 78.0–100.0)
PLATELETS: 111 10*3/uL — AB (ref 150–400)
RBC: 3.47 MIL/uL — AB (ref 4.22–5.81)
RDW: 13.6 % (ref 11.5–15.5)
WBC: 11.3 10*3/uL — AB (ref 4.0–10.5)

## 2015-09-28 LAB — TROPONIN I
TROPONIN I: 25.13 ng/mL — AB (ref <0.031)
TROPONIN I: 26.08 ng/mL — AB (ref <0.031)

## 2015-09-28 LAB — PHOSPHORUS: PHOSPHORUS: 2.9 mg/dL (ref 2.5–4.6)

## 2015-09-28 LAB — HEPARIN LEVEL (UNFRACTIONATED): Heparin Unfractionated: 0.19 IU/mL — ABNORMAL LOW (ref 0.30–0.70)

## 2015-09-28 LAB — PROCALCITONIN: PROCALCITONIN: 0.51 ng/mL

## 2015-09-28 MED ORDER — INSULIN ASPART 100 UNIT/ML ~~LOC~~ SOLN
0.0000 [IU] | Freq: Three times a day (TID) | SUBCUTANEOUS | Status: DC
  Administered 2015-09-28: 1 [IU] via SUBCUTANEOUS

## 2015-09-28 MED ORDER — HEPARIN BOLUS VIA INFUSION
2500.0000 [IU] | Freq: Once | INTRAVENOUS | Status: AC
  Administered 2015-09-28: 2500 [IU] via INTRAVENOUS
  Filled 2015-09-28: qty 2500

## 2015-09-28 MED ORDER — LACTATED RINGERS IV SOLN
INTRAVENOUS | Status: AC
  Administered 2015-09-28: 22:00:00 via INTRAVENOUS

## 2015-09-28 MED ORDER — HEPARIN BOLUS VIA INFUSION
4000.0000 [IU] | Freq: Once | INTRAVENOUS | Status: AC
  Administered 2015-09-28: 4000 [IU] via INTRAVENOUS
  Filled 2015-09-28: qty 4000

## 2015-09-28 MED ORDER — HALOPERIDOL LACTATE 5 MG/ML IJ SOLN
INTRAMUSCULAR | Status: AC
  Administered 2015-09-29: 2 mg via INTRAVENOUS
  Filled 2015-09-28: qty 1

## 2015-09-28 MED ORDER — INSULIN GLARGINE 100 UNIT/ML ~~LOC~~ SOLN
40.0000 [IU] | SUBCUTANEOUS | Status: DC
  Administered 2015-09-28: 40 [IU] via SUBCUTANEOUS
  Filled 2015-09-28 (×2): qty 0.4

## 2015-09-28 MED ORDER — HALOPERIDOL LACTATE 5 MG/ML IJ SOLN
1.0000 mg | INTRAMUSCULAR | Status: DC | PRN
  Administered 2015-09-29: 2 mg via INTRAVENOUS
  Administered 2015-09-29: 1 mg via INTRAVENOUS
  Administered 2015-09-29: 3 mg via INTRAVENOUS
  Administered 2015-09-29: 2 mg via INTRAVENOUS
  Filled 2015-09-28 (×3): qty 1

## 2015-09-28 MED ORDER — HEPARIN (PORCINE) IN NACL 100-0.45 UNIT/ML-% IJ SOLN
1450.0000 [IU]/h | INTRAMUSCULAR | Status: DC
  Administered 2015-09-28: 1150 [IU]/h via INTRAVENOUS
  Administered 2015-09-29 (×2): 1450 [IU]/h via INTRAVENOUS
  Filled 2015-09-28 (×3): qty 250

## 2015-09-28 MED ORDER — SODIUM CHLORIDE 0.9 % IV BOLUS (SEPSIS)
1000.0000 mL | Freq: Once | INTRAVENOUS | Status: AC
  Administered 2015-09-28: 1000 mL via INTRAVENOUS

## 2015-09-28 MED ORDER — INSULIN ASPART 100 UNIT/ML ~~LOC~~ SOLN: 0.0000 [IU] | Freq: Every day | SUBCUTANEOUS | Status: DC

## 2015-09-28 MED ORDER — ASPIRIN 325 MG PO TABS: 325.0000 mg | ORAL_TABLET | Freq: Every day | ORAL | Status: DC

## 2015-09-28 NOTE — Progress Notes (Signed)
Utilization review completed.  

## 2015-09-28 NOTE — H&P (Addendum)
PULMONARY / CRITICAL CARE MEDICINE   Name: DIVONTE SENGER MRN: [id number] DOB: Mar 25, 1941    ADMISSION DATE:  09/28/2015   CONSULTATION DATE:  10/13/2015  REFERRING MD:  ED  CHIEF COMPLAINT:  Shortness of breath  HISTORY OF PRESENT ILLNESS:   This is a 75 yo white male with a h/o Type 2 DM and CVA  With residual right-sided weakness who presents via EMS with SOB, syncope and bradycardia. History is obtained from ED records as patient does not recall what brought him to the hospital. EMS was called for acute onset shortness of breath. Patient had flulike symptoms, abdominal pain, nausea, diarrhea and generalized malaise for several days. Last night patient became SOB. This afternoon, symptoms got worse and EMS was called. U[pon EMS arrival, patient was found to be tachypneic and had a brief syncopal episode and bradycardia with HR in the 30s as he was being transferred to the hospital. He was given 1 mg atropine and HR improved to the 70s. Enroute to the ED, he had another episode of bradycardia but HR improved.  His blood glucose in the field was in the 500s. Patient is currently alert. He denies pain, sob, chest pain, nausea and vomiting.  In the ED, patient was hypotensive, severely hyperglycemic,  his troponin was elevated and he had some EKG changes.    PAST MEDICAL HISTORY :  He  has a past medical history of Diabetes mellitus without complication (HCC) and Stroke (HCC).  PAST SURGICAL HISTORY: He  has no past surgical history on file.  No Known Allergies  No current facility-administered medications on file prior to encounter.   No current outpatient prescriptions on file prior to encounter.    FAMILY HISTORY:  His has no family status information on file.   SOCIAL HISTORY: He  reports that he has never smoked. He does not have any smokeless tobacco history on file. He reports that he does not drink alcohol.   SUBJECTIVE:  Awake and confused  VITAL SIGNS: BP 108/63  mmHg  Pulse 91  Temp(Src) 97.6 F (36.4 C) (Oral)  Resp 24  Ht 6\' 1"  (1.854 m)  Wt 203 lb 4.2 oz (92.2 kg)  BMI 26.82 kg/m2  SpO2 100%  HEMODYNAMICS:    VENTILATOR SETTINGS:    INTAKE / OUTPUT: I/O last 3 completed shifts: In: 1611 [I.V.:1561; IV Piggyback:50] Out: 200 [Urine:200]  PHYSICAL EXAMINATION: General: Chronically ill appearing male.  Neuro:  AAO X 1, No focal deficits HEENT: PERRLA, trachea midline Cardiovascular: RRR, S1/S2, no MRG. Lungs: Mild resp distress, No wheeze or crackles Abdomen: Obese, soft, NT, normal bowel sounds Musculoskeletal: No joint deformities Skin: Intact  LABS:  BMET  Recent Labs Lab 09/28/15 0100 09/28/15 0500 09/28/15 0845  NA 143 145 145  K 3.9 3.9 3.7  CL 110 112* 110  CO2 17* 20* 21*  BUN 47* 50* 51*  CREATININE 3.76* 3.15* 3.18*  GLUCOSE 258* 133* 87    Electrolytes  Recent Labs Lab 10/11/2015 1642  09/28/15 0100 09/28/15 0500 09/28/15 0845  CALCIUM 9.4  < > 9.1 9.2 9.2  MG 1.9  --   --  1.8  --   PHOS 4.6  --   --  2.9  --   < > = values in this interval not displayed.  CBC  Recent Labs Lab 10/03/2015 1329  10/25/2015 1642 10/24/2015 1707 09/28/15 0500  WBC 7.7  --  5.8  --  11.3*  HGB 11.2*  < > 11.3*  11.9* 11.2*  HCT 34.4*  < > 33.7* 35.0* 32.4*  PLT 158  --  124*  --  111*  < > = values in this interval not displayed.  Coag's  Recent Labs Lab 03/02/16 1329  APTT 30  INR 1.37    Sepsis Markers  Recent Labs Lab 03/02/16 1642 03/02/16 1707 03/02/16 2253 09/28/15 0500  LATICACIDVEN  --  7.04* 6.9* 4.4*  PROCALCITON 0.26  --   --  0.51    ABG No results for input(s): PHART, PCO2ART, PO2ART in the last 168 hours.  Liver Enzymes  Recent Labs Lab 03/02/16 1329  AST 111*  ALT 89*  ALKPHOS 52  BILITOT 3.0*  ALBUMIN 3.2*    Cardiac Enzymes  Recent Labs Lab 09/28/15 0845  TROPONINI 25.13*    Glucose  Recent Labs Lab 09/28/15 0304 09/28/15 0405 09/28/15 0454  09/28/15 0600 09/28/15 0701 09/28/15 0808  GLUCAP 206* 139* 153* 130* 133* 82    Imaging Ct Abdomen Pelvis Wo Contrast  09/30/2015  CLINICAL DATA:  Short of breath. sepsis. Elevated blood sugar. Diabetes. EXAM: CT CHEST, ABDOMEN AND PELVIS WITHOUT CONTRAST TECHNIQUE: Multidetector CT imaging of the chest, abdomen and pelvis was performed following the standard protocol without IV contrast. COMPARISON:  Plain film chest earlier today.  No prior CTs. FINDINGS: CT CHEST Mediastinum/Nodes: Aortic and branch vessel atherosclerosis. Tortuous descending thoracic aorta. Mild cardiomegaly. Multivessel coronary artery atherosclerosis. Air within the right atrium and jugular veins is likely iatrogenic. No mediastinal or definite hilar adenopathy, given limitations of unenhanced CT. Lungs/Pleura: Small bilateral pleural effusions. Multifactorial mild to moderate degradation. Motion and patient arm position (not raised above the head). Bibasilar dependent airspace disease. Musculoskeletal: No acute osseous abnormality. CT ABDOMEN AND PELVIS Hepatobiliary: Position and motion degradation continues into the upper abdomen. Grossly normal noncontrast appearance of the liver. The gallbladder is hyper attenuating, but suboptimally evaluated. No biliary duct dilatation. Pancreas: Mild to moderate pancreatic atrophy, without duct dilatation or surrounding inflammation. Spleen: Normal in size, without focal abnormality. Adrenals/Urinary Tract: Normal adrenal glands. Mild bilateral renal cortical thinning. No renal calculi or hydronephrosis. No hydroureter or ureteric calculi. No bladder calculi. Stomach/Bowel: Stomach is collapsed. Extensive colonic diverticulosis. Mild sigmoid wall thickening is likely due to muscular hypertrophy. Normal terminal ileum and appendix. Normal small bowel. Vascular/Lymphatic: Aortic and branch vessel atherosclerosis. No abdominopelvic adenopathy. Reproductive: Normal prostate. Other: No significant  free fluid. Musculoskeletal: Lumbosacral spondylosis. IMPRESSION: 1. Degraded exam, secondary to motion, lack of IV contrast, and patient arm position, not raised above the head. 2. Small bilateral pleural effusions with adjacent bibasilar atelectasis. 3. Hepatic steatosis. 4. Hyper attenuation within the gallbladder, suboptimally evaluated. This could represent stones or sludge. If the patient has abdominal symptoms, consider ultrasound. 5. Atherosclerosis, including within the coronary arteries. Electronically Signed   By: Jeronimo GreavesKyle  Talbot M.D.   On: 07-24-16 18:21   Ct Chest Wo Contrast  09/25/2015  CLINICAL DATA:  Short of breath. sepsis. Elevated blood sugar. Diabetes. EXAM: CT CHEST, ABDOMEN AND PELVIS WITHOUT CONTRAST TECHNIQUE: Multidetector CT imaging of the chest, abdomen and pelvis was performed following the standard protocol without IV contrast. COMPARISON:  Plain film chest earlier today.  No prior CTs. FINDINGS: CT CHEST Mediastinum/Nodes: Aortic and branch vessel atherosclerosis. Tortuous descending thoracic aorta. Mild cardiomegaly. Multivessel coronary artery atherosclerosis. Air within the right atrium and jugular veins is likely iatrogenic. No mediastinal or definite hilar adenopathy, given limitations of unenhanced CT. Lungs/Pleura: Small bilateral pleural effusions. Multifactorial mild to moderate degradation.  Motion and patient arm position (not raised above the head). Bibasilar dependent airspace disease. Musculoskeletal: No acute osseous abnormality. CT ABDOMEN AND PELVIS Hepatobiliary: Position and motion degradation continues into the upper abdomen. Grossly normal noncontrast appearance of the liver. The gallbladder is hyper attenuating, but suboptimally evaluated. No biliary duct dilatation. Pancreas: Mild to moderate pancreatic atrophy, without duct dilatation or surrounding inflammation. Spleen: Normal in size, without focal abnormality. Adrenals/Urinary Tract: Normal adrenal glands.  Mild bilateral renal cortical thinning. No renal calculi or hydronephrosis. No hydroureter or ureteric calculi. No bladder calculi. Stomach/Bowel: Stomach is collapsed. Extensive colonic diverticulosis. Mild sigmoid wall thickening is likely due to muscular hypertrophy. Normal terminal ileum and appendix. Normal small bowel. Vascular/Lymphatic: Aortic and branch vessel atherosclerosis. No abdominopelvic adenopathy. Reproductive: Normal prostate. Other: No significant free fluid. Musculoskeletal: Lumbosacral spondylosis. IMPRESSION: 1. Degraded exam, secondary to motion, lack of IV contrast, and patient arm position, not raised above the head. 2. Small bilateral pleural effusions with adjacent bibasilar atelectasis. 3. Hepatic steatosis. 4. Hyper attenuation within the gallbladder, suboptimally evaluated. This could represent stones or sludge. If the patient has abdominal symptoms, consider ultrasound. 5. Atherosclerosis, including within the coronary arteries. Electronically Signed   By: Jeronimo Greaves M.D.   On: October 08, 2015 18:21   Dg Chest Port 1 View  10-08-15  CLINICAL DATA:  Dyspnea, abdominal pain, body aches EXAM: PORTABLE CHEST 1 VIEW COMPARISON:  04/22/2015 FINDINGS: Cardiomediastinal silhouette is stable. No acute infiltrate or pleural effusion. No pulmonary edema. Bony thorax is unremarkable. IMPRESSION: No active disease. Electronically Signed   By: Natasha Mead M.D.   On: Oct 08, 2015 15:29   STUDIES:  01/03 CT abdomen and pelvis>> Small bilateral effusions, Hepatic steatosis.   CULTURES: 01/03 Blood and  urine 01/03>> NGTD  ANTIBIOTICS: Zosyn 01/03>>  SIGNIFICANT EVENTS: 01/03>>ED with SOB, bradycardia, DKA  And elevated troponin  LINES/TUBES: PIVs  DISCUSSION: 75 yo male presenting with DKA, ARF,  transient syncopal episodes secondary to bradycardia and elevated troponin  ASSESSMENT / PLAN:  PULMONARY A: No acute issues P:   Supplemental O2 to keep O2 saturation> 90% Check resp  virus panel, Flu PCR BiPAP prn  CARDIOVASCULAR A:  Bradycardia Elevated troponin. Demand ischemia vs NSTEMI. P:  Cardiology following, appreciate recs EKG, Echocardiogram. ASA, start heparin anticoagulation Cycle troponin  RENAL A:   Acute renal failure secondary to volume depletion. Now anuric Hyperkalemia P:   IV fluids. Will need more fluids. Bolus 1 lt and NS at 125 cc/hr.  If he does not respond then we will need to consult nephrology. BMP Q6H Foley Strict I/O  GASTROINTESTINAL A:   Nausea and diarrhea(per patient's wife) P:   CT abdomen is OK. NPO  HEMATOLOGIC A:   No acute issues P:  Monitor CBC  INFECTIOUS A:   R/o sepsis P:   F/U cultures Empiric antibiotics, zosyn Check procalcitonin  ENDOCRINE A:   DKA Type 2 DM P:   Blood glucose monitoring, insulin gtt and fluid resuscitation per DKA protocol Diabetes education consult  NEUROLOGIC A:   Metabolic encephalopathy H/O CVA P:   RASS goal: Neuro checks ASA  FAMILY  - Updates: Wife updated at bedside.  - Inter-disciplinary family meet or Palliative Care meeting due by:  day 7  Critical care time - 45 mins.   Chilton Greathouse MD [facility] Pager [phone] If no answer or after 3pm call: [phone] 09/28/2015, 10:52 AM

## 2015-09-28 NOTE — Progress Notes (Addendum)
PROGRESS NOTE  Subjective:   75 y.o. male with a history of Diabetes, proliferative diabetic retinopathy and stroke with residual right-sided weakness [facility] 10/17/2015 for evaluation of shortness of breath. He was found to have DKA, severe hyperkalemia, bradycardia and + troponin levels. We were asked to comment on his + Troponin levels and ECG   More awake this am , although still lethargic.   Objective:    Vital Signs:   Temp:  [96.8 F (36 C)-97.7 F (36.5 C)] 97.4 F (36.3 C) (01/04 0400) Pulse Rate:  [28-155] 28 (01/04 0600) Resp:  [11-29] 19 (01/04 0600) BP: (86-118)/(45-72) 91/59 mmHg (01/04 0600) SpO2:  [84 %-100 %] 89 % (01/04 0600) Weight:  [203 lb 4.2 oz (92.2 kg)-250 lb (113.399 kg)] 203 lb 4.2 oz (92.2 kg) (01/03 1800)      24-hour weight change: Weight change:   Weight trends: Filed Weights   10/11/2015 1530 09/26/2015 1800  Weight: 250 lb (113.399 kg) 203 lb 4.2 oz (92.2 kg)    Intake/Output:  01/03 0701 - 01/04 0700 In: 1486 [I.V.:1436; IV Piggyback:50] Out: 200 [Urine:200]     Physical Exam: BP 91/59 mmHg  Pulse 28  Temp(Src) 97.4 F (36.3 C) (Axillary)  Resp 19  Ht 6\' 1"  (1.854 m)  Wt 203 lb 4.2 oz (92.2 kg)  BMI 26.82 kg/m2  SpO2 89%  Wt Readings from Last 3 Encounters:  10/18/2015 203 lb 4.2 oz (92.2 kg)    General: Vital signs reviewed and noted.   Head: Normocephalic, atraumatic.  Eyes: conjunctivae/corneas clear.  EOM's intact.   Throat: normal  Neck:    Lungs:    clear   Heart:  RR   Abdomen:  Soft, non-tender, non-distended    Extremities: No edema    Neurologic: A&O X3, CN II - XII are grossly intact.   Psych: Normal     Labs: BMET:  Recent Labs  10/15/2015 1642  09/28/15 0100 09/28/15 0500  NA 138  < > 143 145  K 6.4*  < > 3.9 3.9  CL 106  < > 110 112*  CO2 16*  < > 17* 20*  GLUCOSE 514*  < > 258* 133*  BUN 48*  < > 47* 50*  CREATININE 3.09*  < > 3.76* 3.15*  CALCIUM  9.4  < > 9.1 9.2  MG 1.9  --   --  1.8  PHOS 4.6  --   --  2.9  < > = values in this interval not displayed.  Liver function tests:  Recent Labs  10/24/2015 1329  AST 111*  ALT 89*  ALKPHOS 52  BILITOT 3.0*  PROT 6.0*  ALBUMIN 3.2*   No results for input(s): LIPASE, AMYLASE in the last 72 hours.  CBC:  Recent Labs  10/11/2015 1642 09/26/2015 1707 09/28/15 0500  WBC 5.8  --  11.3*  NEUTROABS 4.8  --   --   HGB 11.3* 11.9* 11.2*  HCT 33.7* 35.0* 32.4*  MCV 96.3  --  93.4  PLT 124*  --  111*    Cardiac Enzymes: No results for input(s): CKTOTAL, CKMB, TROPONINI in the last 72 hours.  Coagulation Studies:  Recent Labs  10/01/2015 1329  LABPROT 17.0*  INR 1.37    Other: Invalid input(s): POCBNP No results for input(s): DDIMER in the last 72 hours. No results for input(s): HGBA1C in the last 72 hours. No results for input(s): CHOL, HDL, LDLCALC,  TRIG, CHOLHDL in the last 72 hours.  Recent Labs  09-30-15 1642  TSH 3.873   No results for input(s): VITAMINB12, FOLATE, FERRITIN, TIBC, IRON, RETICCTPCT in the last 72 hours.   Other results:  EKG  ( personally reviewed )  -   Medications:    Infusions: . sodium chloride Stopped (Sep 30, 2015 1718)  . sodium chloride Stopped (09/28/15 0106)  . dextrose 5 % and 0.45% NaCl 125 mL/hr at 09/28/15 0106  . insulin (NOVOLIN-R) infusion 2.6 mL/hr at 09/28/15 0700    Scheduled Medications: . heparin  5,000 Units Subcutaneous 3 times per day  . piperacillin-tazobactam (ZOSYN)  IV  3.375 g Intravenous Q8H    Assessment/ Plan:   Active Problems:   DKA (diabetic ketoacidoses) (HCC)  1.  Elevated troponin - I suspect this is due to his DKA. Denies any chest pain  Will get echo today  Repeat ECG this am   2. DKA:   Further plans per IM / PCCM    Disposition:  Length of Stay: 1  Vesta Mixer, Montez Hageman., MD, [facility] 09/28/2015, 7:25 AM Office [phone] Pager [phone]   Addendum: Troponin levels is 25. Will start  heparin drip ECG shows some ST depression but no ST elevation. I suspect this NSTEMI is due to her severe metabolic issues on admission  Echo later today      Nahser, Deloris Ping, MD  09/28/2015 11:12 AM    [facility] [address] [place],  [address] [place], [place]  [address] Pager [phone] Phone: [phone]; Fax: [phone]

## 2015-09-28 NOTE — Progress Notes (Signed)
ANTICOAGULATION CONSULT NOTE - Initial Consult  Pharmacy Consult for heparin Indication: NSTEMI  No Known Allergies  Patient Measurements: Height: 6\' 1"  (185.4 cm) Weight: 203 lb 4.2 oz (92.2 kg) IBW/kg (Calculated) : 79.9 Heparin Dosing Weight: 92.2 kg  Vital Signs: Temp: 96.9 F (36.1 C) (01/04 1143) Temp Source: Axillary (01/04 1143) BP: 108/63 mmHg (01/04 0805) Pulse Rate: 91 (01/04 0805)  Labs:  Recent Labs  2015-11-17 1329  2015-11-17 1642 2015-11-17 1707  09/28/15 0100 09/28/15 0500 09/28/15 0845  HGB 11.2*  < > 11.3* 11.9*  --   --  11.2*  --   HCT 34.4*  < > 33.7* 35.0*  --   --  32.4*  --   PLT 158  --  124*  --   --   --  111*  --   APTT 30  --   --   --   --   --   --   --   LABPROT 17.0*  --   --   --   --   --   --   --   INR 1.37  --   --   --   --   --   --   --   CREATININE 3.89*  < > 3.09* 3.00*  < > 3.76* 3.15* 3.18*  TROPONINI  --   --   --   --   --   --   --  25.13*  < > = values in this interval not displayed.  Estimated Creatinine Clearance: 22.3 mL/min (by C-G formula based on Cr of 3.18).   Medical History: Past Medical History  Diagnosis Date  . Diabetes mellitus without complication (HCC)   . Stroke [facility](HCC)     Medications:  Prescriptions prior to admission  Medication Sig Dispense Refill Last Dose  . glimepiride (AMARYL) 4 MG tablet Take 4 mg by mouth daily with breakfast.   10/08/2015 at Unknown time  . insulin glargine (LANTUS) 100 UNIT/ML injection Inject 64 Units into the skin daily.   09/28/2015 at Unknown time  . lisinopril-hydrochlorothiazide (PRINZIDE,ZESTORETIC) 20-12.5 MG tablet Take 1 tablet by mouth daily.   09/25/2015 at Unknown time  . metFORMIN (GLUCOPHAGE-XR) 750 MG 24 hr tablet Take 750 mg by mouth every other day.   Past Week at Unknown time  . tamsulosin (FLOMAX) 0.4 MG CAPS capsule Take 0.4 mg by mouth 2 (two) times daily.   10/01/2015 at Unknown time  . terbinafine (LAMISIL) 250 MG tablet Take 250 mg by mouth daily.    10/24/2015 at Unknown time    Assessment: 75 yo M presenting w/ SOB, bradycardia, syncope. PMH DM2, CVA w/ residual weakness. Found to be in DKA w/ possible sepsis. Pharmacy is now consulted to dose heparin for NSTEMI likely resulting from metabolic derangements. EKG showing ST depression and troponin elevated to 25.13.   Hgb 11.2, Plt 111  Goal of Therapy:  Heparin level 0.3-0.7 units/ml Monitor platelets by anticoagulation protocol: Yes   Plan:  Give 4000 units bolus x 1 Start heparin infusion at 1150 units/hr Check anti-Xa level in 8 hours and daily while on heparin Continue to monitor H&H and platelets  Arcola JanskyMeagan Zaray Gatchel, PharmD Clinical Pharmacy Resident Pager: [phone]' 09/28/2015,1:09 PM

## 2015-09-28 NOTE — Progress Notes (Signed)
EEG Completed; Results Pending  

## 2015-09-28 NOTE — Progress Notes (Signed)
eLink Physician-Brief Progress Note Patient Name: [name] DOB: Jan 12, 1941 MRN: [id number]   Date of Service  09/28/2015  HPI/Events of Note  RN notified blood cultures now with GPC in 1/2 sets.  eICU Interventions  Continuing on Zosyn.     Intervention Category Intermediate Interventions: Infection - evaluation and management  Lawanda CousinsJennings [place] 09/28/2015, 3:47 PM

## 2015-09-28 NOTE — Progress Notes (Signed)
ANTICOAGULATION CONSULT NOTE - Follow Up Consult  Pharmacy Consult for heparin Indication: NSTEMI  No Known Allergies  Patient Measurements: Height: 6\' 1"  (185.4 cm) Weight: 203 lb 4.2 oz (92.2 kg) IBW/kg (Calculated) : 79.9  Vital Signs: Temp: 97.1 F (36.2 C) (01/04 1600) Temp Source: Oral (01/04 1600) BP: 125/86 mmHg (01/04 2200) Pulse Rate: 86 (01/04 2200)  Labs:  Recent Labs  June 07, 2016 1329  June 07, 2016 1642 June 07, 2016 1707  09/28/15 0500 09/28/15 0845 09/28/15 1555 09/28/15 2146  HGB 11.2*  < > 11.3* 11.9*  --  11.2*  --   --   --   HCT 34.4*  < > 33.7* 35.0*  --  32.4*  --   --   --   PLT 158  --  124*  --   --  111*  --   --   --   APTT 30  --   --   --   --   --   --   --   --   LABPROT 17.0*  --   --   --   --   --   --   --   --   INR 1.37  --   --   --   --   --   --   --   --   HEPARINUNFRC  --   --   --   --   --   --   --   --  0.19*  CREATININE 3.89*  < > 3.09* 3.00*  < > 3.15* 3.18* 2.94* 2.97*  TROPONINI  --   --   --   --   --   --  25.13* 26.08*  --   < > = values in this interval not displayed.  Estimated Creatinine Clearance: 23.9 mL/min (by C-G formula based on Cr of 2.97).   Assessment: 75 yo M presenting w/ SOB, bradycardia, syncope. PMH DM2, CVA w/ residual weakness. Found to be in DKA w/ possible sepsis. Pharmacy is now consulted to dose heparin for NSTEMI likely resulting from metabolic derangements. EKG showing ST depression and troponin elevated to 25.13. First HL was low at 0.19. No issues per RN.  Goal of Therapy:  Heparin level 0.3-0.7 units/ml Monitor platelets by anticoagulation protocol: Yes   Plan:  Give 2,500 unit heparin BOLUS Increase heparin gtt to 1,450 units/hr Check 8 hr HL Monitor daily HL, CBC, s/s of bleed  Enzo BiNathan Jahmeer Porche, PharmD, [facility] Clinical Pharmacist Pager [phone] 09/28/2015 10:33 PM

## 2015-09-28 NOTE — Progress Notes (Signed)
eLink Physician-Brief Progress Note Patient Name: [name] DOB: September 24, 1941 MRN: [id number]   Date of Service  09/28/2015  HPI/Events of Note  UOP remains low & patient NPO pending speech evaluation.   eICU Interventions  Starting LR 100cc/hr x10 hours.     Intervention Category Intermediate Interventions: Oliguria - evaluation and management  Lawanda CousinsJennings Nestor 09/28/2015, 7:05 PM

## 2015-09-28 NOTE — Progress Notes (Signed)
Wife called staff to room, pt was choking on his lunch, attempted to give a second bite after choking subsided, pt not able to swallow without coughing profusely, pt make NPO again, MD contacted for swallow eval orders.    Eliane DecreeSmith, Dylon Correa Moore, RN

## 2015-09-28 NOTE — Progress Notes (Signed)
Critical result troponin 25.13.  Dr Elease HashimotoNahser notified, no new orders at this time.    Eliane DecreeSmith, Nicole Defino Moore, RN

## 2015-09-28 NOTE — Procedures (Signed)
ELECTROENCEPHALOGRAM REPORT   Patient: [name]        Age: 75 y.o.        Sex: male Referring Physician: Dr Craige CottaSood Report Date:  09/28/2015        Interpreting Physician: Omelia BlackwaterSUMNER, Aldin Drees JUSTIN  History: [name] is an 75 y.o. male admitted with DKA now with AMS  Medications:  I have reviewed the patient's current medications.  Conditions of Recording:  This is a 16 channel EEG carried out with the patient in the drowsy state.  Description:  The background activity consists predominantly of a low voltage, symmetrical, fairly well organized, theta activity, seen from the parieto-occipital and posterior temporal regions.  No focal slowing or epileptiform activity noted.   Hyperventilation was not performed.  Intermittent photic stimulation was not performed   IMPRESSION: Abnormal EEG due to the presence of generalized slowing indicating a mild to moderate cerebral disturbance (encephalopathy). No epileptiform activity noted.    Elspeth Choeter Maeryn Mcgath, DO [facility] [phone]  If 7pm- 7am, please page neurology on call as listed in AMION. 09/28/2015, 6:23 PM

## 2015-09-29 ENCOUNTER — Inpatient Hospital Stay (HOSPITAL_COMMUNITY): Payer: Medicare Other

## 2015-09-29 DIAGNOSIS — I213 ST elevation (STEMI) myocardial infarction of unspecified site: Secondary | ICD-10-CM

## 2015-09-29 DIAGNOSIS — J9600 Acute respiratory failure, unspecified whether with hypoxia or hypercapnia: Secondary | ICD-10-CM | POA: Diagnosis present

## 2015-09-29 LAB — BASIC METABOLIC PANEL
ANION GAP: 14 (ref 5–15)
Anion gap: 14 (ref 5–15)
BUN: 61 mg/dL — ABNORMAL HIGH (ref 6–20)
BUN: 63 mg/dL — AB (ref 6–20)
CHLORIDE: 111 mmol/L (ref 101–111)
CO2: 18 mmol/L — AB (ref 22–32)
CO2: 19 mmol/L — ABNORMAL LOW (ref 22–32)
Calcium: 9.1 mg/dL (ref 8.9–10.3)
Calcium: 9 mg/dL (ref 8.9–10.3)
Chloride: 111 mmol/L (ref 101–111)
Creatinine, Ser: 3.03 mg/dL — ABNORMAL HIGH (ref 0.61–1.24)
Creatinine, Ser: 2.8 mg/dL — ABNORMAL HIGH (ref 0.61–1.24)
GFR calc Af Amer: 22 mL/min — ABNORMAL LOW (ref >60)
GFR calc Af Amer: 24 mL/min — ABNORMAL LOW (ref >60)
GFR calc non Af Amer: 19 mL/min — ABNORMAL LOW (ref >60)
GFR calc non Af Amer: 20 mL/min — ABNORMAL LOW (ref >60)
GLUCOSE: 85 mg/dL (ref 65–99)
GLUCOSE: 69 mg/dL (ref 65–99)
POTASSIUM: 4.2 mmol/L (ref 3.5–5.1)
POTASSIUM: 4.2 mmol/L (ref 3.5–5.1)
Sodium: 143 mmol/L (ref 135–145)
Sodium: 144 mmol/L (ref 135–145)

## 2015-09-29 LAB — BLOOD GAS, ARTERIAL
ACID-BASE DEFICIT: 7 mmol/L — AB (ref 0.0–2.0)
BICARBONATE: 16.6 meq/L — AB (ref 20.0–24.0)
DRAWN BY: 398661
FIO2: 0.21
O2 Saturation: 91 %
PH ART: 7.436 (ref 7.350–7.450)
PO2 ART: 63.3 mmHg — AB (ref 80.0–100.0)
Patient temperature: 97.1
TCO2: 17.4 mmol/L (ref 0–100)
pCO2 arterial: 24.8 mmHg — ABNORMAL LOW (ref 35.0–45.0)

## 2015-09-29 LAB — CBC
HEMATOCRIT: 35 % — AB (ref 39.0–52.0)
HEMOGLOBIN: 11.4 g/dL — AB (ref 13.0–17.0)
MCH: 32.1 pg (ref 26.0–34.0)
MCHC: 32.6 g/dL (ref 30.0–36.0)
MCV: 98.6 fL (ref 78.0–100.0)
Platelets: 155 10*3/uL (ref 150–400)
RBC: 3.55 MIL/uL — AB (ref 4.22–5.81)
RDW: 14 % (ref 11.5–15.5)
WBC: 14.4 10*3/uL — ABNORMAL HIGH (ref 4.0–10.5)

## 2015-09-29 LAB — HEPATIC FUNCTION PANEL
ALBUMIN: 2.9 g/dL — AB (ref 3.5–5.0)
ALK PHOS: 52 U/L (ref 38–126)
ALT: 693 U/L — ABNORMAL HIGH (ref 17–63)
AST: 596 U/L — AB (ref 15–41)
BILIRUBIN DIRECT: 0.4 mg/dL (ref 0.1–0.5)
BILIRUBIN TOTAL: 1.6 mg/dL — AB (ref 0.3–1.2)
Indirect Bilirubin: 1.2 mg/dL — ABNORMAL HIGH (ref 0.3–0.9)
Total Protein: 6.2 g/dL — ABNORMAL LOW (ref 6.5–8.1)

## 2015-09-29 LAB — GLUCOSE, CAPILLARY
GLUCOSE-CAPILLARY: 127 mg/dL — AB (ref 65–99)
GLUCOSE-CAPILLARY: 75 mg/dL (ref 65–99)
GLUCOSE-CAPILLARY: 49 mg/dL — AB (ref 65–99)
GLUCOSE-CAPILLARY: 112 mg/dL — AB (ref 65–99)
Glucose-Capillary: 117 mg/dL — ABNORMAL HIGH (ref 65–99)
Glucose-Capillary: 140 mg/dL — ABNORMAL HIGH (ref 65–99)
Glucose-Capillary: 59 mg/dL — ABNORMAL LOW (ref 65–99)
Glucose-Capillary: 70 mg/dL (ref 65–99)

## 2015-09-29 LAB — TROPONIN I
TROPONIN I: 30.52 ng/mL — AB (ref <0.031)
TROPONIN I: 28.78 ng/mL — AB (ref <0.031)
Troponin I: 28.81 ng/mL (ref <0.031)

## 2015-09-29 LAB — LACTIC ACID, PLASMA: Lactic Acid, Venous: 3.2 mmol/L (ref 0.5–2.0)

## 2015-09-29 LAB — PHOSPHORUS: Phosphorus: 3.7 mg/dL (ref 2.5–4.6)

## 2015-09-29 LAB — PROCALCITONIN: Procalcitonin: 0.51 ng/mL

## 2015-09-29 LAB — HEPARIN LEVEL (UNFRACTIONATED)
HEPARIN UNFRACTIONATED: 0.49 [IU]/mL (ref 0.30–0.70)
Heparin Unfractionated: 0.41 IU/mL (ref 0.30–0.70)

## 2015-09-29 LAB — MAGNESIUM: MAGNESIUM: 1.8 mg/dL (ref 1.7–2.4)

## 2015-09-29 MED ORDER — ASPIRIN 300 MG RE SUPP
300.0000 mg | Freq: Every day | RECTAL | Status: DC
  Administered 2015-09-29 – 2015-10-03 (×5): 300 mg via RECTAL
  Filled 2015-09-29 (×6): qty 1

## 2015-09-29 MED ORDER — VANCOMYCIN HCL IN DEXTROSE 1-5 GM/200ML-% IV SOLN
1000.0000 mg | INTRAVENOUS | Status: DC
  Administered 2015-09-30 – 2015-10-01 (×2): 1000 mg via INTRAVENOUS
  Filled 2015-09-29 (×2): qty 200

## 2015-09-29 MED ORDER — ASPIRIN 81 MG PO CHEW: 81.0000 mg | CHEWABLE_TABLET | Freq: Every day | ORAL | Status: DC

## 2015-09-29 MED ORDER — SODIUM CHLORIDE 0.9 % IV SOLN: INTRAVENOUS | Status: DC

## 2015-09-29 MED ORDER — PERFLUTREN LIPID MICROSPHERE
INTRAVENOUS | Status: AC
  Filled 2015-09-29: qty 10

## 2015-09-29 MED ORDER — HALOPERIDOL LACTATE 5 MG/ML IJ SOLN
1.0000 mg | INTRAMUSCULAR | Status: DC | PRN
  Administered 2015-09-29: 4 mg via INTRAVENOUS
  Administered 2015-09-29 (×2): 2 mg via INTRAVENOUS
  Administered 2015-09-29 – 2015-09-30 (×2): 4 mg via INTRAVENOUS
  Administered 2015-10-01: 1 mg via INTRAVENOUS
  Administered 2015-10-07: 4 mg via INTRAVENOUS
  Filled 2015-09-29 (×6): qty 1

## 2015-09-29 MED ORDER — FUROSEMIDE 10 MG/ML IJ SOLN
20.0000 mg | Freq: Once | INTRAMUSCULAR | Status: AC
  Administered 2015-09-29: 20 mg via INTRAVENOUS

## 2015-09-29 MED ORDER — MIDAZOLAM HCL 2 MG/2ML IJ SOLN: 1.0000 mg | INTRAMUSCULAR | Status: DC | PRN

## 2015-09-29 MED ORDER — PERFLUTREN LIPID MICROSPHERE
1.0000 mL | INTRAVENOUS | Status: AC | PRN
  Administered 2015-09-29: 2 mL via INTRAVENOUS
  Filled 2015-09-29: qty 10

## 2015-09-29 MED ORDER — DEXTROSE 50 % IV SOLN
INTRAVENOUS | Status: AC
  Administered 2015-09-29: 50 mL via INTRAVENOUS
  Filled 2015-09-29: qty 50

## 2015-09-29 MED ORDER — INSULIN GLARGINE 100 UNIT/ML ~~LOC~~ SOLN
32.0000 [IU] | SUBCUTANEOUS | Status: DC
  Administered 2015-09-29: 32 [IU] via SUBCUTANEOUS
  Filled 2015-09-29 (×2): qty 0.32

## 2015-09-29 MED ORDER — VANCOMYCIN HCL IN DEXTROSE 1-5 GM/200ML-% IV SOLN: 1000.0000 mg | Freq: Once | INTRAVENOUS | Status: DC

## 2015-09-29 MED ORDER — DEXTROSE 50 % IV SOLN
1.0000 | Freq: Once | INTRAVENOUS | Status: AC
  Administered 2015-09-29: 50 mL via INTRAVENOUS

## 2015-09-29 MED ORDER — VANCOMYCIN HCL 10 G IV SOLR
1750.0000 mg | Freq: Once | INTRAVENOUS | Status: AC
  Administered 2015-09-29: 1750 mg via INTRAVENOUS
  Filled 2015-09-29: qty 1750

## 2015-09-29 MED ORDER — FUROSEMIDE 10 MG/ML IJ SOLN
INTRAMUSCULAR | Status: AC
  Administered 2015-09-29: 20 mg via INTRAVENOUS
  Filled 2015-09-29: qty 2

## 2015-09-29 NOTE — Progress Notes (Signed)
ANTICOAGULATION CONSULT NOTE - Follow Up Consult  Pharmacy Consult for heparin Indication: NSTEMI  No Known Allergies  Patient Measurements: Height: 6\' 1"  (185.4 cm) Weight: 203 lb 4.2 oz (92.2 kg) IBW/kg (Calculated) : 79.9  Vital Signs: BP: 93/63 mmHg (01/05 1000) Pulse Rate: 81 (01/05 1000)  Labs:  Recent Labs  2016/04/13 1329  2016/04/13 1642 2016/04/13 1707  09/28/15 0500  09/28/15 1555 09/28/15 2146 09/29/15 0105 09/29/15 0300 09/29/15 0751 09/29/15 0946 09/29/15 1541  HGB 11.2*  < > 11.3* 11.9*  --  11.2*  --   --   --   --  11.4*  --   --   --   HCT 34.4*  < > 33.7* 35.0*  --  32.4*  --   --   --   --  35.0*  --   --   --   PLT 158  --  124*  --   --  111*  --   --   --   --  155  --   --   --   APTT 30  --   --   --   --   --   --   --   --   --   --   --   --   --   LABPROT 17.0*  --   --   --   --   --   --   --   --   --   --   --   --   --   INR 1.37  --   --   --   --   --   --   --   --   --   --   --   --   --   HEPARINUNFRC  --   --   --   --   --   --   --   --  0.19*  --   --  0.41  --  0.49  CREATININE 3.89*  < > 3.09* 3.00*  < > 3.15*  < > 2.94* 2.97* 3.03* 2.80*  --   --   --   TROPONINI  --   --   --   --   --   --   < > 26.08*  --   --   --   --  30.52* 28.81*  < > = values in this interval not displayed.  Estimated Creatinine Clearance: 25.4 mL/min (by C-G formula based on Cr of 2.8).   Assessment: 75 yo M on heparin for NSTEMI, recheck HL 0.49 (therapeutic) on 1450 units/hr, CBC stable, no bleeding noted  Goal of Therapy:  Heparin level 0.3-0.7 units/ml Monitor platelets by anticoagulation protocol: Yes   Plan:  Continue heparin gtt at 1450 units/hr Monitor daily HL, CBC, s/s of bleed  Bayard HuggerMei Lamyia Cdebaca, PharmD, BCPS  Clinical Pharmacist  Pager: [phone]   09/29/2015 6:33 PM

## 2015-09-29 NOTE — Progress Notes (Signed)
Pt disoriented, confused and agitated, removing electrodes and pulling at tubing. Not letting nurse put him back on the monitor, yelling "get away from me" and combative. CCM notified, received order for haldol 1-4mg  q3h PRN. Will continue to monitor.

## 2015-09-29 NOTE — Progress Notes (Signed)
eLink Physician-Brief Progress Note Patient Name: [name] DOB: [birth year] MRN: [id number]   Date of Service  09/29/2015  HPI/Events of Note  RUQ reviewed. Fatty infiltration of the liver with right effusion. Normal CBD & GB.  eICU Interventions  No change in plan of care.     Intervention Category Intermediate Interventions: Other:  Lawanda CousinsJennings Nestor 09/29/2015, 7:44 PM

## 2015-09-29 NOTE — Progress Notes (Signed)
  Echocardiogram 2D Echocardiogram Limited has been performed.  Leta JunglingCooper, Efrat Zuidema M 09/29/2015, 9:19 AM

## 2015-09-29 NOTE — Progress Notes (Signed)
PROGRESS NOTE  Subjective:   75 y.o. male with a history of Diabetes, proliferative diabetic retinopathy and stroke with residual right-sided weakness presented to [facility] ED by EMS 2015/10/13 for evaluation of shortness of breath. He was found to have DKA, severe hyperkalemia, bradycardia and + troponin levels. We were asked to comment on his + Troponin levels and ECG    Very  Lethargic. Minimally responsive this am    Objective:    Vital Signs:   Temp:  [96.9 F (36.1 C)-98.1 F (36.7 C)] 97.9 F (36.6 C) (01/05 0500) Pulse Rate:  [39-111] 111 (01/05 0600) Resp:  [12-43] 43 (01/05 0600) BP: (91-142)/(31-100) 142/87 mmHg (01/05 0600) SpO2:  [89 %-100 %] 95 % (01/05 0600)      24-hour weight change: Weight change:   Weight trends: Filed Weights   13-Oct-2015 1530 10/13/2015 1800  Weight: 250 lb (113.399 kg) 203 lb 4.2 oz (92.2 kg)    Intake/Output:  01/04 0701 - 01/05 0700 In: 2330.4 [I.V.:2080.4; IV Piggyback:250] Out: 400 [Urine:400]     Physical Exam: BP 142/87 mmHg  Pulse 111  Temp(Src) 97.9 F (36.6 C) (Axillary)  Resp 43  Ht 6\' 1"  (1.854 m)  Wt 203 lb 4.2 oz (92.2 kg)  BMI 26.82 kg/m2  SpO2 95%  Wt Readings from Last 3 Encounters:  October 13, 2015 203 lb 4.2 oz (92.2 kg)    General: Vital signs reviewed and noted.   Head: Normocephalic, atraumatic.  Eyes: conjunctivae/corneas clear.  EOM's intact.   Throat: normal  Neck:    Lungs:    clear   Heart:  RR   Abdomen:  Soft, non-tender, non-distended    Extremities: No edema    Neurologic: A&O X3, CN II - XII are grossly intact.   Psych: Encephalopathic , minimally cooperative     Labs: BMET:  Recent Labs  09/28/15 0500  09/29/15 0105 09/29/15 0300  NA 145  < > 143 144  K 3.9  < > 4.2 4.2  CL 112*  < > 111 111  CO2 20*  < > 18* 19*  GLUCOSE 133*  < > 85 69  BUN 50*  < > 61* 63*  CREATININE 3.15*  < > 3.03* 2.80*  CALCIUM 9.2  < > 9.1 9.0  MG 1.8  --   --  1.8  PHOS 2.9  --    --  3.7  < > = values in this interval not displayed.  Liver function tests:  Recent Labs  10-13-15 1329  AST 111*  ALT 89*  ALKPHOS 52  BILITOT 3.0*  PROT 6.0*  ALBUMIN 3.2*   No results for input(s): LIPASE, AMYLASE in the last 72 hours.  CBC:  Recent Labs  13-Oct-2015 1642  09/28/15 0500 09/29/15 0300  WBC 5.8  --  11.3* 14.4*  NEUTROABS 4.8  --   --   --   HGB 11.3*  < > 11.2* 11.4*  HCT 33.7*  < > 32.4* 35.0*  MCV 96.3  --  93.4 98.6  PLT 124*  --  111* 155  < > = values in this interval not displayed.  Cardiac Enzymes:  Recent Labs  09/28/15 0845 09/28/15 1555  TROPONINI 25.13* 26.08*    Coagulation Studies:  Recent Labs  2015-10-13 1329  LABPROT 17.0*  INR 1.37    Other: Invalid input(s): POCBNP No results for input(s): DDIMER in the last 72 hours. No results for input(s): HGBA1C in the last 72  hours. No results for input(s): CHOL, HDL, LDLCALC, TRIG, CHOLHDL in the last 72 hours.  Recent Labs  09/28/2015 1642  TSH 3.873   No results for input(s): VITAMINB12, FOLATE, FERRITIN, TIBC, IRON, RETICCTPCT in the last 72 hours.   Other results:  EKG  ( personally reviewed )  -  NSR , RBBB   Medications:    Infusions: . sodium chloride Stopped (10/08/2015 1718)  . sodium chloride    . heparin 1,450 Units/hr (09/29/15 0420)    Scheduled Medications: . aspirin  325 mg Oral Daily  . insulin aspart  0-15 Units Subcutaneous TID WC  . insulin aspart  0-5 Units Subcutaneous QHS  . insulin glargine  40 Units Subcutaneous Q24H  . piperacillin-tazobactam (ZOSYN)  IV  3.375 g Intravenous Q8H    Assessment/ Plan:   Active Problems:   DKA (diabetic ketoacidoses) (HCC)  1.  Elevated troponin - I suspect this is due to his DKA. Denies any chest pain  Will get echo today  Given his lack of cooperation , It would be difficult to do any further eval for CAD at this time     2. DKA:   Further plans per IM / PCCM  3. Encephalopathy:   Abnormal  EEG. Further management per neuro   Disposition:  Length of Stay: 2  Alvia GrovePhilip J. Emmert Roethler, Jr., MD, [facility] 09/29/2015, 8:13 AM Office [phone] Pager [phone]   Addendum: Troponin levels is 25. Will start heparin drip ECG shows some ST depression but no ST elevation. I suspect this NSTEMI is due to her severe metabolic issues on admission  Echo later today      Koya Hunger, Deloris PingPhilip J, MD  09/29/2015 8:13 AM    [facility] [address],  [address] [place], [place]  [address] Pager [phone] Phone: [phone]; Fax: [phone]

## 2015-09-29 NOTE — Progress Notes (Addendum)
PULMONARY / CRITICAL CARE MEDICINE   Name: [name] MRN: [id number] DOB: 03-18-1941    ADMISSION DATE:  10/02/2015   CONSULTATION DATE:  10/16/2015  REFERRING MD:  ED  CHIEF COMPLAINT:  Shortness of breath  HISTORY OF PRESENT ILLNESS:   This is a 75 yo white male with a h/o Type 2 DM and CVA  With residual right-sided weakness who presents via EMS with SOB, syncope and bradycardia. History is obtained from ED records as patient does not recall what brought him to the hospital. EMS was called for acute onset shortness of breath. Patient had flulike symptoms, abdominal pain, nausea, diarrhea and generalized malaise for several days. Last night patient became SOB. This afternoon, symptoms got worse and EMS was called. U[pon EMS arrival, patient was found to be tachypneic and had a brief syncopal episode and bradycardia with HR in the 30s as he was being transferred to the hospital. He was given 1 mg atropine and HR improved to the 70s. Enroute to the ED, he had another episode of bradycardia but HR improved.  His blood glucose in the field was in the 500s. Patient is currently alert. He denies pain, sob, chest pain, nausea and vomiting.  In the ED, patient was hypotensive, severely hyperglycemic,  his troponin was elevated and he had some EKG changes.    PAST MEDICAL HISTORY :  He  has a past medical history of Diabetes mellitus without complication (HCC) and Stroke (HCC).  PAST SURGICAL HISTORY: He  has no past surgical history on file.  No Known Allergies  No current facility-administered medications on file prior to encounter.   No current outpatient prescriptions on file prior to encounter.    FAMILY HISTORY:  His has no family status information on file.   SOCIAL HISTORY: He  reports that he has never smoked. He does not have any smokeless tobacco history on file. He reports that he does not drink alcohol.   SUBJECTIVE:  More encephalopathic today with delirium and  agitation.  VITAL SIGNS: BP 142/87 mmHg  Pulse 111  Temp(Src) 97.9 F (36.6 C) (Axillary)  Resp 43  Ht 6\' 1"  (1.854 m)  Wt 203 lb 4.2 oz (92.2 kg)  BMI 26.82 kg/m2  SpO2 95%  HEMODYNAMICS:    VENTILATOR SETTINGS:    INTAKE / OUTPUT: I/O last 3 completed shifts: In: 3940.4 [I.V.:3640.4; IV Piggyback:300] Out: 600 [Urine:600]  PHYSICAL EXAMINATION: General: Chronically ill appearing male. Somnolent, unarousable Neuro:  AAO X 1, No focal deficits HEENT: PERRLA, trachea midline Cardiovascular: RRR, S1/S2, no MRG. Lungs: Mild resp distress, No wheeze or crackles Abdomen: Obese, soft, NT, normal bowel sounds Musculoskeletal: No joint deformities Skin: Intact  LABS:  BMET  Recent Labs Lab 09/28/15 2146 09/29/15 0105 09/29/15 0300  NA 142 143 144  K 4.2 4.2 4.2  CL 111 111 111  CO2 17* 18* 19*  BUN 58* 61* 63*  CREATININE 2.97* 3.03* 2.80*  GLUCOSE 109* 85 69    Electrolytes  Recent Labs Lab 09/25/2015 1642  09/28/15 0500  09/28/15 2146 09/29/15 0105 09/29/15 0300  CALCIUM 9.4  < > 9.2  < > 8.9 9.1 9.0  MG 1.9  --  1.8  --   --   --  1.8  PHOS 4.6  --  2.9  --   --   --  3.7  < > = values in this interval not displayed.  CBC  Recent Labs Lab 10/12/2015 1642 10/11/2015 1707 09/28/15 0500 09/29/15  0300  WBC 5.8  --  11.3* 14.4*  HGB 11.3* 11.9* 11.2* 11.4*  HCT 33.7* 35.0* 32.4* 35.0*  PLT 124*  --  111* 155    Coag's  Recent Labs Lab 10-04-2015 1329  APTT 30  INR 1.37    Sepsis Markers  Recent Labs Lab October 04, 2015 1642  09/28/15 0500 09/28/15 1200 09/29/15 0300  LATICACIDVEN  --   < > 4.4* 3.6* 3.2*  PROCALCITON 0.26  --  0.51  --  0.51  < > = values in this interval not displayed.  ABG  Recent Labs Lab 09/29/15 0619  PHART 7.436  PCO2ART 24.8*  PO2ART 63.3*    Liver Enzymes  Recent Labs Lab 2015-10-04 1329 09/29/15 0300  AST 111* 596*  ALT 89* 693*  ALKPHOS 52 52  BILITOT 3.0* 1.6*  ALBUMIN 3.2* 2.9*    Cardiac  Enzymes  Recent Labs Lab 09/28/15 0845 09/28/15 1555  TROPONINI 25.13* 26.08*    Glucose  Recent Labs Lab 09/28/15 1111 09/28/15 1218 09/28/15 1339 09/28/15 1433 09/28/15 1537 09/28/15 2135  GLUCAP 124* 111* 140* 128* 121* 103*    Imaging Ct Head Wo Contrast  09/28/2015  CLINICAL DATA:  Increase confusion, difficulty swallowing EXAM: CT HEAD WITHOUT CONTRAST TECHNIQUE: Contiguous axial images were obtained from the base of the skull through the vertex without intravenous contrast. COMPARISON:  05/03/2007 FINDINGS: No skull fracture is noted. There is mucosal thickening right maxillary sinus. The mastoid air cells are unremarkable. No intracranial hemorrhage, mass effect or midline shift. Moderate cerebral atrophy with progression from prior exam. There is periventricular and patchy subcortical white matter decreased attenuation consistent with chronic small vessel ischemic changes. Small lacunar infarct is noted in left posterior corona radiata. No definite acute cortical infarction. No mass lesion is noted on this unenhanced scan. IMPRESSION: No acute intracranial abnormality. No definite acute cortical infarction. Moderate cerebral atrophy with progression from prior exam. Small lacunar infarct noted in left posterior corona radiata. Periventricular and patchy subcortical white matter decreased attenuation probable due to chronic small vessel ischemic changes. Electronically Signed   By: Natasha Mead M.D.   On: 09/28/2015 14:56   Dg Chest Port 1 View  09/29/2015  CLINICAL DATA:  Diabetes. EXAM: PORTABLE CHEST 1 VIEW COMPARISON:  CT 10-04-15. FINDINGS: Cardiomegaly with diffuse bilateral pulmonary alveolar infiltrates consistent with congestive heart failure pulmonary edema. Small pleural effusions. No pneumothorax . No acute osseous abnormality . IMPRESSION: Congestive heart failure with pulmonary edema. Electronically Signed   By: Maisie Fus  Register   On: 09/29/2015 08:45   STUDIES:   01/03 CT abdomen and pelvis>> Small bilateral effusions, Hepatic steatosis. 1/4 CT head >> No acute finding 1/4/ EEG >> Diffuse slowing, encephalopathy, no seizures.   CULTURES: 01/03 Blood >> GPC  1/03 Urine >> NGTD  ANTIBIOTICS: Zosyn 1/3>> Vanco 1/5 >>  SIGNIFICANT EVENTS: 01/03>>ED with SOB, bradycardia, DKA  And elevated troponin.  LINES/TUBES: PIVs  DISCUSSION: 75 yo male presenting with DKA, ARF,  transient syncopal episodes secondary to bradycardia and elevated troponin. Now in multiorgan failure with elevated LFTs, AKI, and delirium, enceophalopathy.   ASSESSMENT / PLAN:  PULMONARY A: No acute issues P:   Supplemental O2 to keep O2 saturation> 90% BiPAP prn  CARDIOVASCULAR A:  Bradycardia Elevated troponin. Demand ischemia vs NSTEMI. P:  Cardiology following, appreciate recs Follow Echocardiogram. ASA, On heparin anticoagulation Cycle troponin. Not a candidate for cath now Will start lasix for diuresis as he had CHF and pleural effusion on CXR. Lasix  20 gm IV once.   RENAL A:   Acute renal failure. Now anuric Hyperkalemia >> resolved P:   Still with low urine output now with volume overload. Will start gentle diuresis Strict I/O  GASTROINTESTINAL A:   Nausea and diarrhea(per patient's wife) Elevated LFTs - maybe shock liver. P:   CT abdomen is OK. Check RUQ U/S NPO  HEMATOLOGIC A:   No acute issues P:  Monitor CBC  INFECTIOUS A:   Sepsis, GPC bacteremia P:   F/U cultures Empiric antibiotics, zosyn Add vanco for GPCs Follow procalcitonin  ENDOCRINE A:   DKA Type 2 DM P:   Off insulin drip. Hypoglycemic today.  Will hold lantus.  D50 amp. SSI.  NEUROLOGIC A:   Metabolic encephalopathy H/O CVA P:   RASS goal: Neuro checks ASA  FAMILY  - Updates: Wife and son updated at bedside 1/5. Explained that he is in multiorgan failure likely from severe acidosis, metabolic abnormalities, possible sepsis. Prognosis is  looking increasingly grim. That understand. He has a living will and code status is DNR/DNI. OK for supplemental O2 and NIV. - Inter-disciplinary family meet or Palliative Care meeting due by:  day 7  Critical care time - 45 mins.   Chilton Greathouse MD [facility] Pager [phone] If no answer or after 3pm call: [phone] 09/29/2015, 9:11 AM

## 2015-09-29 NOTE — Progress Notes (Addendum)
SLP Cancellation Note  Patient Details Name: [name] MRN: [id number] DOB: Dec 02, 1940   Cancelled treatment:        SLP checked on pt who was wheezing and blue tint to lips, decreased sats but questionable wave form. RN arrived repositioned pt and sats monitor. Pt's at 100% with decreased work of breathing. Technician arrived for procedure. Pt likely not appropriate for swallow assessment today. Will ask RN to page if pt improves.   Royce MacadamiaLitaker, Enzio Buchler Willis 09/29/2015, 8:56 AM

## 2015-09-29 NOTE — Progress Notes (Signed)
ANTICOAGULATION CONSULT NOTE - Follow Up Consult  Pharmacy Consult for heparin Indication: NSTEMI  No Known Allergies  Patient Measurements: Height: 6\' 1"  (185.4 cm) Weight: 203 lb 4.2 oz (92.2 kg) IBW/kg (Calculated) : 79.9  Vital Signs: Temp: 97.9 F (36.6 C) (01/05 0500) Temp Source: Axillary (01/05 0500) BP: 142/87 mmHg (01/05 0600) Pulse Rate: 111 (01/05 0600)  Labs:  Recent Labs  10/17/2015 1329  10/03/2015 1642 09/30/2015 1707  09/28/15 0500 09/28/15 0845 09/28/15 1555 09/28/15 2146 09/29/15 0105 09/29/15 0300 09/29/15 0751  HGB 11.2*  < > 11.3* 11.9*  --  11.2*  --   --   --   --  11.4*  --   HCT 34.4*  < > 33.7* 35.0*  --  32.4*  --   --   --   --  35.0*  --   PLT 158  --  124*  --   --  111*  --   --   --   --  155  --   APTT 30  --   --   --   --   --   --   --   --   --   --   --   LABPROT 17.0*  --   --   --   --   --   --   --   --   --   --   --   INR 1.37  --   --   --   --   --   --   --   --   --   --   --   HEPARINUNFRC  --   --   --   --   --   --   --   --  0.19*  --   --  0.41  CREATININE 3.89*  < > 3.09* 3.00*  < > 3.15* 3.18* 2.94* 2.97* 3.03* 2.80*  --   TROPONINI  --   --   --   --   --   --  25.13* 26.08*  --   --   --   --   < > = values in this interval not displayed.  Estimated Creatinine Clearance: 25.4 mL/min (by C-G formula based on Cr of 2.8).   Assessment: 75 yo M presenting w/ SOB, bradycardia, syncope. PMH DM2, CVA w/ residual weakness. Pharmacy is now consulted to dose heparin for NSTEMI, trop positive and ECG changes.  HL 0.41 (therapeutic), CBC stable, no bleeding noted  Goal of Therapy:  Heparin level 0.3-0.7 units/ml Monitor platelets by anticoagulation protocol: Yes   Plan:  Continue heparin gtt at 1450 units/hr Confirm HL in 8 hr Monitor daily HL, CBC, s/s of bleed  Remi HaggardAlyson N. Cleaster Shiffer, PharmD Clinical Pharmacist- Resident Pager: [phone]   09/29/2015 8:56 AM

## 2015-09-29 NOTE — Progress Notes (Signed)
ANTIBIOTIC CONSULT NOTE - INITIAL  Pharmacy Consult for Vancomycin/Zosyn Indication: Bacteremia  No Known Allergies  Patient Measurements: Height: 6\' 1"  (185.4 cm) Weight: 203 lb 4.2 oz (92.2 kg) IBW/kg (Calculated) : 79.9  Vital Signs: Temp: 97.9 F (36.6 C) (01/05 0500) Temp Source: Axillary (01/05 0500) BP: 142/87 mmHg (01/05 0600) Pulse Rate: 111 (01/05 0600) Intake/Output from previous day: 01/04 0701 - 01/05 0700 In: 2330.4 [I.V.:2080.4; IV Piggyback:250] Out: 400 [Urine:400] Intake/Output from this shift:    Labs:  Recent Labs  10/07/2015 1642 10/14/2015 1707  09/28/15 0500  09/28/15 2146 09/29/15 0105 09/29/15 0300  WBC 5.8  --   --  11.3*  --   --   --  14.4*  HGB 11.3* 11.9*  --  11.2*  --   --   --  11.4*  PLT 124*  --   --  111*  --   --   --  155  CREATININE 3.09* 3.00*  < > 3.15*  < > 2.97* 3.03* 2.80*  < > = values in this interval not displayed. Estimated Creatinine Clearance: 25.4 mL/min (by C-G formula based on Cr of 2.8). No results for input(s): VANCOTROUGH, VANCOPEAK, VANCORANDOM, GENTTROUGH, GENTPEAK, GENTRANDOM, TOBRATROUGH, TOBRAPEAK, TOBRARND, AMIKACINPEAK, AMIKACINTROU, AMIKACIN in the last 72 hours.   Microbiology: Recent Results (from the past 720 hour(s))  Blood Culture (routine x 2)     Status: None (Preliminary result)   Collection Time: 10/19/2015  4:42 PM  Result Value Ref Range Status   Specimen Description BLOOD LEFT HAND  Final   Special Requests IN PEDIATRIC BOTTLE 1CC  Final   Culture NO GROWTH < 24 HOURS  Final   Report Status PENDING  Incomplete  Blood Culture (routine x 2)     Status: None (Preliminary result)   Collection Time: 09/25/2015  4:58 PM  Result Value Ref Range Status   Specimen Description BLOOD LEFT FOREARM  Final   Special Requests BOTTLES DRAWN AEROBIC AND ANAEROBIC 5CC  Final   Culture  Setup Time   Final    GRAM POSITIVE COCCI IN CLUSTERS ANAEROBIC BOTTLE ONLY CRITICAL RESULT CALLED TO, READ BACK BY AND  VERIFIED WITH: Candy Sledge SMITH RN 1530 09/28/15 A BROWNING    Culture   Final    STAPHYLOCOCCUS SPECIES (COAGULASE NEGATIVE) THE SIGNIFICANCE OF ISOLATING THIS ORGANISM FROM A SINGLE SET OF BLOOD CULTURES WHEN MULTIPLE SETS ARE DRAWN IS UNCERTAIN. PLEASE NOTIFY THE MICROBIOLOGY DEPARTMENT WITHIN ONE WEEK IF SPECIATION AND SENSITIVITIES ARE REQUIRED.    Report Status PENDING  Incomplete  MRSA PCR Screening     Status: None   Collection Time: 09/30/2015  6:28 PM  Result Value Ref Range Status   MRSA by PCR NEGATIVE NEGATIVE Final    Comment:        The GeneXpert MRSA Assay (FDA approved for NASAL specimens only), is one component of a comprehensive MRSA colonization surveillance program. It is not intended to diagnose MRSA infection nor to guide or monitor treatment for MRSA infections.   Urine culture     Status: None (Preliminary result)   Collection Time: 09/28/15  7:16 PM  Result Value Ref Range Status   Specimen Description URINE, CLEAN CATCH  Final   Special Requests NONE  Final   Culture NO GROWTH < 12 HOURS  Final   Report Status PENDING  Incomplete    Medical History: Past Medical History  Diagnosis Date  . Diabetes mellitus without complication (HCC)   . Stroke [facility](HCC)  Medications:  Scheduled:  . aspirin  325 mg Oral Daily  . dextrose  1 ampule Intravenous Once  . furosemide  20 mg Intravenous Once  . insulin aspart  0-15 Units Subcutaneous TID WC  . insulin aspart  0-5 Units Subcutaneous QHS  . insulin glargine  40 Units Subcutaneous Q24H  . perflutren lipid microspheres (DEFINITY) IV suspension      . piperacillin-tazobactam (ZOSYN)  IV  3.375 g Intravenous Q8H  . vancomycin  1,000 mg Intravenous Once   Assessment: [name] presenting to [facility] with SOB and found to be in DKA. Pt now 1/2 blood cultures positive for GPC in clusters. Pharmacy consulted to dose Vancomycin. WBC 14.4, afeb  Goal of Therapy:  Vancomycin trough level 15-20 mcg/ml  Plan:  Vancomycin 1750mg   LD, followed by 1000mg  Q 24h Continue Zosyn 3.375G Q8hr Will continue to follow renal function, culture results, LOT, and antibiotic de-escalation plans   Remi Haggard, PharmD Clinical Pharmacist- Resident Pager: [phone]  Remi Haggard 09/29/2015,9:11 AM

## 2015-09-30 ENCOUNTER — Inpatient Hospital Stay (HOSPITAL_COMMUNITY): Payer: Medicare Other

## 2015-09-30 DIAGNOSIS — I214 Non-ST elevation (NSTEMI) myocardial infarction: Secondary | ICD-10-CM

## 2015-09-30 LAB — GLUCOSE, CAPILLARY
GLUCOSE-CAPILLARY: 119 mg/dL — AB (ref 65–99)
GLUCOSE-CAPILLARY: 149 mg/dL — AB (ref 65–99)
GLUCOSE-CAPILLARY: 149 mg/dL — AB (ref 65–99)
GLUCOSE-CAPILLARY: 154 mg/dL — AB (ref 65–99)
GLUCOSE-CAPILLARY: 64 mg/dL — AB (ref 65–99)
GLUCOSE-CAPILLARY: 67 mg/dL (ref 65–99)
GLUCOSE-CAPILLARY: 84 mg/dL (ref 65–99)
GLUCOSE-CAPILLARY: 93 mg/dL (ref 65–99)
Glucose-Capillary: 157 mg/dL — ABNORMAL HIGH (ref 65–99)
Glucose-Capillary: 84 mg/dL (ref 65–99)
Glucose-Capillary: 85 mg/dL (ref 65–99)
Glucose-Capillary: 56 mg/dL — ABNORMAL LOW (ref 65–99)
Glucose-Capillary: 113 mg/dL — ABNORMAL HIGH (ref 65–99)

## 2015-09-30 LAB — TROPONIN I
Troponin I: 26.19 ng/mL (ref <0.031)
Troponin I: 19.73 ng/mL (ref <0.031)
Troponin I: 18.77 ng/mL (ref <0.031)

## 2015-09-30 LAB — COMPREHENSIVE METABOLIC PANEL
ALBUMIN: 3 g/dL — AB (ref 3.5–5.0)
ALK PHOS: 49 U/L (ref 38–126)
ALT: 578 U/L — AB (ref 17–63)
AST: 286 U/L — AB (ref 15–41)
Anion gap: 13 (ref 5–15)
BILIRUBIN TOTAL: 1.1 mg/dL (ref 0.3–1.2)
BUN: 72 mg/dL — AB (ref 6–20)
CO2: 20 mmol/L — ABNORMAL LOW (ref 22–32)
CREATININE: 2.58 mg/dL — AB (ref 0.61–1.24)
Calcium: 8.8 mg/dL — ABNORMAL LOW (ref 8.9–10.3)
Chloride: 111 mmol/L (ref 101–111)
GFR calc Af Amer: 26 mL/min — ABNORMAL LOW (ref >60)
GFR calc non Af Amer: 23 mL/min — ABNORMAL LOW (ref >60)
GLUCOSE: 112 mg/dL — AB (ref 65–99)
POTASSIUM: 3.6 mmol/L (ref 3.5–5.1)
Sodium: 144 mmol/L (ref 135–145)
TOTAL PROTEIN: 5.8 g/dL — AB (ref 6.5–8.1)

## 2015-09-30 LAB — BLOOD GAS, ARTERIAL
ACID-BASE DEFICIT: 5 mmol/L — AB (ref 0.0–2.0)
Bicarbonate: 18.5 mEq/L — ABNORMAL LOW (ref 20.0–24.0)
Drawn by: 398661
O2 Content: 2 L/min
O2 SAT: 96.6 %
PATIENT TEMPERATURE: 98.6
PCO2 ART: 28.6 mmHg — AB (ref 35.0–45.0)
TCO2: 19.4 mmol/L (ref 0–100)
pH, Arterial: 7.427 (ref 7.350–7.450)
pO2, Arterial: 96.6 mmHg (ref 80.0–100.0)

## 2015-09-30 LAB — CULTURE, BLOOD (ROUTINE X 2)

## 2015-09-30 LAB — URINE CULTURE: Culture: NO GROWTH

## 2015-09-30 LAB — LACTIC ACID, PLASMA: Lactic Acid, Venous: 2.2 mmol/L (ref 0.5–2.0)

## 2015-09-30 LAB — MAGNESIUM: Magnesium: 1.9 mg/dL (ref 1.7–2.4)

## 2015-09-30 LAB — CBC
HEMATOCRIT: 32.3 % — AB (ref 39.0–52.0)
Hemoglobin: 10.5 g/dL — ABNORMAL LOW (ref 13.0–17.0)
MCH: 31.5 pg (ref 26.0–34.0)
MCHC: 32.5 g/dL (ref 30.0–36.0)
MCV: 97 fL (ref 78.0–100.0)
PLATELETS: 133 10*3/uL — AB (ref 150–400)
RBC: 3.33 MIL/uL — ABNORMAL LOW (ref 4.22–5.81)
RDW: 13.8 % (ref 11.5–15.5)
WBC: 9.9 10*3/uL (ref 4.0–10.5)

## 2015-09-30 LAB — HEPARIN LEVEL (UNFRACTIONATED): Heparin Unfractionated: 0.51 IU/mL (ref 0.30–0.70)

## 2015-09-30 LAB — PROCALCITONIN: Procalcitonin: 0.38 ng/mL

## 2015-09-30 LAB — AMMONIA: AMMONIA: 33 umol/L (ref 9–35)

## 2015-09-30 LAB — PHOSPHORUS: Phosphorus: 3.9 mg/dL (ref 2.5–4.6)

## 2015-09-30 MED ORDER — DEXTROSE 50 % IV SOLN
INTRAVENOUS | Status: AC
  Filled 2015-09-30: qty 50

## 2015-09-30 MED ORDER — FUROSEMIDE 10 MG/ML IJ SOLN
20.0000 mg | Freq: Once | INTRAMUSCULAR | Status: AC
  Administered 2015-09-30: 20 mg via INTRAVENOUS
  Filled 2015-09-30: qty 2

## 2015-09-30 MED ORDER — DEXTROSE 50 % IV SOLN
INTRAVENOUS | Status: AC
  Administered 2015-09-30: 50 mL
  Filled 2015-09-30: qty 50

## 2015-09-30 MED ORDER — INSULIN GLARGINE 100 UNIT/ML ~~LOC~~ SOLN
28.0000 [IU] | SUBCUTANEOUS | Status: DC
  Filled 2015-09-30: qty 0.28

## 2015-09-30 MED ORDER — DEXTROSE 10 % IV SOLN
INTRAVENOUS | Status: AC
  Administered 2015-09-30 – 2015-10-01 (×2): via INTRAVENOUS

## 2015-09-30 MED ORDER — DEXTROSE 50 % IV SOLN: 1.0000 | Freq: Once | INTRAVENOUS | Status: DC

## 2015-09-30 MED ORDER — IPRATROPIUM-ALBUTEROL 0.5-2.5 (3) MG/3ML IN SOLN
3.0000 mL | RESPIRATORY_TRACT | Status: DC | PRN
  Administered 2015-09-30 – 2015-10-02 (×3): 3 mL via RESPIRATORY_TRACT
  Filled 2015-09-30 (×3): qty 3

## 2015-09-30 MED ORDER — INSULIN GLARGINE 100 UNIT/ML ~~LOC~~ SOLN
14.0000 [IU] | SUBCUTANEOUS | Status: DC
  Filled 2015-09-30: qty 0.14

## 2015-09-30 MED ORDER — DEXTROSE 50 % IV SOLN
1.0000 | Freq: Once | INTRAVENOUS | Status: AC
  Administered 2015-09-30: 50 mL via INTRAVENOUS

## 2015-09-30 NOTE — Progress Notes (Signed)
ANTICOAGULATION CONSULT NOTE - Follow Up Consult  Pharmacy Consult for heparin Indication: NSTEMI  No Known Allergies  Patient Measurements: Height: 6\' 1"  (185.4 cm) Weight: 203 lb 4.2 oz (92.2 kg) IBW/kg (Calculated) : 79.9  Vital Signs: Temp: 97.7 F (36.5 C) (01/06 0731) Temp Source: Oral (01/06 0731) BP: 106/66 mmHg (01/06 0731) Pulse Rate: 85 (01/06 0731)  Labs:  Recent Labs  10/07/2015 1329  09/28/15 0500  09/29/15 0105 09/29/15 0300 09/29/15 0751 09/29/15 0946 09/29/15 1541 09/29/15 2123 09/30/15 0305  HGB 11.2*  < > 11.2*  --   --  11.4*  --   --   --   --  10.5*  HCT 34.4*  < > 32.4*  --   --  35.0*  --   --   --   --  32.3*  PLT 158  < > 111*  --   --  155  --   --   --   --  133*  APTT 30  --   --   --   --   --   --   --   --   --   --   LABPROT 17.0*  --   --   --   --   --   --   --   --   --   --   INR 1.37  --   --   --   --   --   --   --   --   --   --   HEPARINUNFRC  --   --   --   < >  --   --  0.41  --  0.49  --  0.51  CREATININE 3.89*  < > 3.15*  < > 3.03* 2.80*  --   --   --   --  2.58*  TROPONINI  --   --   --   < >  --   --   --  30.52* 28.81* 28.78*  --   < > = values in this interval not displayed.  Estimated Creatinine Clearance: 27.5 mL/min (by C-G formula based on Cr of 2.58).   Assessment: 75 yo M on heparin for NSTEMI, trop positive and ECG changes. HL 0.51 (therapeutic) on 1450 units/hr, CBC stable, no bleeding noted  Goal of Therapy:  Heparin level 0.3-0.7 units/ml Monitor platelets by anticoagulation protocol: Yes   Plan:  Continue heparin gtt at 1450 units/hr Monitor daily HL, CBC, s/s of bleed  Remi HaggardAlyson N. Treshun Wold, PharmD Clinical Pharmacist- Resident Pager: [phone]   09/30/2015 7:45 AM

## 2015-09-30 NOTE — Progress Notes (Signed)
PROGRESS NOTE  Subjective:   75 y.o. male with a history of Diabetes, proliferative diabetic retinopathy and stroke with residual right-sided weakness presented to [facility] ED by EMS 10-19-15 for evaluation of shortness of breath. He was found to have DKA, severe hyperkalemia, bradycardia and + troponin levels. We were asked to comment on his + Troponin levels and ECG    Very  Lethargic. Minimally responsive this am  Still altered   Objective:    Vital Signs:   Temp:  [97.4 F (36.3 C)-98.8 F (37.1 C)] 97.7 F (36.5 C) (01/06 0731) Pulse Rate:  [74-103] 85 (01/06 0731) Resp:  [14-33] 24 (01/06 0731) BP: (90-141)/(58-92) 106/66 mmHg (01/06 0731) SpO2:  [96 %-100 %] 100 % (01/06 0731)      24-hour weight change: Weight change:   Weight trends: Filed Weights   10/19/15 1530 Oct 19, 2015 1800  Weight: 250 lb (113.399 kg) 203 lb 4.2 oz (92.2 kg)    Intake/Output:  01/05 0701 - 01/06 0700 In: 448 [I.V.:348; IV Piggyback:100] Out: 1160 [Urine:1160]     Physical Exam: BP 106/66 mmHg  Pulse 85  Temp(Src) 97.7 F (36.5 C) (Oral)  Resp 24  Ht 6\' 1"  (1.854 m)  Wt 203 lb 4.2 oz (92.2 kg)  BMI 26.82 kg/m2  SpO2 100%  Wt Readings from Last 3 Encounters:  19-Oct-2015 203 lb 4.2 oz (92.2 kg)    General: Vital signs reviewed and noted.   Head: Normocephalic, atraumatic.  Eyes: conjunctivae/corneas clear.  EOM's intact.   Throat: normal  Neck:    Lungs:    clear   Heart:  RR   Abdomen:  Soft, non-tender, non-distended    Extremities: No edema    Neurologic: A&O X3, CN II - XII are grossly intact.   Psych: Encephalopathic , minimally cooperative     Labs: BMET:  Recent Labs  09/29/15 0300 09/30/15 0305  NA 144 144  K 4.2 3.6  CL 111 111  CO2 19* 20*  GLUCOSE 69 112*  BUN 63* 72*  CREATININE 2.80* 2.58*  CALCIUM 9.0 8.8*  MG 1.8 1.9  PHOS 3.7 3.9    Liver function tests:  Recent Labs  09/29/15 0300 09/30/15 0305  AST 596* 286*    ALT 693* 578*  ALKPHOS 52 49  BILITOT 1.6* 1.1  PROT 6.2* 5.8*  ALBUMIN 2.9* 3.0*   No results for input(s): LIPASE, AMYLASE in the last 72 hours.  CBC:  Recent Labs  2015/10/19 1642  09/29/15 0300 09/30/15 0305  WBC 5.8  < > 14.4* 9.9  NEUTROABS 4.8  --   --   --   HGB 11.3*  < > 11.4* 10.5*  HCT 33.7*  < > 35.0* 32.3*  MCV 96.3  < > 98.6 97.0  PLT 124*  < > 155 133*  < > = values in this interval not displayed.  Cardiac Enzymes:  Recent Labs  09/28/15 1555 09/29/15 0946 09/29/15 1541 09/29/15 2123  TROPONINI 26.08* 30.52* 28.81* 28.78*    Coagulation Studies:  Recent Labs  Oct 19, 2015 1329  LABPROT 17.0*  INR 1.37    Other: Invalid input(s): POCBNP No results for input(s): DDIMER in the last 72 hours. No results for input(s): HGBA1C in the last 72 hours. No results for input(s): CHOL, HDL, LDLCALC, TRIG, CHOLHDL in the last 72 hours.  Recent Labs  2015-10-19 1642  TSH 3.873   No results for input(s): VITAMINB12, FOLATE, FERRITIN, TIBC, IRON, RETICCTPCT in the  last 72 hours.   Other results:  EKG  ( personally reviewed )  -  NSR , RBBB   Medications:    Infusions: . sodium chloride Stopped (10/18/2015 1718)    Scheduled Medications: . aspirin  300 mg Rectal Daily  . insulin glargine  14 Units Subcutaneous Q24H  . piperacillin-tazobactam (ZOSYN)  IV  3.375 g Intravenous Q8H  . vancomycin  1,000 mg Intravenous Q24H    Assessment/ Plan:   Active Problems:   DKA (diabetic ketoacidoses) (HCC)   Acute respiratory failure (HCC)  1.  Elevated troponin - I suspect this is due to his DKA. Denies any chest pain  Echo shows severe LV dysfunction  He is a poor candidate for cath at this time. We can consider cath if he improves from a neurological and renal standpoint  DC heparin  - has been treated for 48 hours.     2. DKA:   Further plans per IM / PCCM  3. Encephalopathy:   Abnormal EEG. Further management per neuro   Continue supportive care    Disposition:  Length of Stay: 3  Vesta MixerPhilip J. Maysun Meditz, Montez HagemanJr., MD, [facility] JeffersonFACC 09/30/2015, 9:28 AM Office [phone] Pager [phone]

## 2015-09-30 NOTE — Evaluation (Signed)
Clinical/Bedside Swallow Evaluation Patient Details  Name: [name] MRN: [id number] Date of Birth: 22-Feb-1941  Today's Date: 09/30/2015 Time: SLP Start Time (ACUTE ONLY): 0957 SLP Stop Time (ACUTE ONLY): 1010 SLP Time Calculation (min) (ACUTE ONLY): 13 min  Past Medical History:  Past Medical History  Diagnosis Date  . Diabetes mellitus without complication (HCC)   . Stroke [facility])    Past Surgical History: No past surgical history on file. HPI:  75 yo white male with a h/o type 2 DM and CVA (s/p 7 yrs)with residual right-sided weakness who presents via EMS with SOB, syncope and bradycardia and flu like symptoms for several days. Pt found to have NSTEMI, acute renal failure , hyperkalemia, sepsis. Pt did not require intubation. CXR congestive heart failure with pulmonary interstitial edema and small right pleural effusion. Overall there has been interval improvement since yesterday's study. Pt' s wife states pt had dysphagia at time of stroke but having "difficulty swallowing over that past month."   Assessment / Plan / Recommendation Clinical Impression  Decreased alertness (given sedatives early this am). Baseline wheezing and work of breathing significantly increased and immediate cough following cup sip water. Suspect decreased oral containment and/or delayed swallow initiation and decreased laryngeal elevation. Pt not safe for po's or appropriate for MBS at present. ST will return next date. Please attempt to hold sedating meds if able to allow pt best opportunity to have MBS.        Aspiration Risk  Severe aspiration risk    Diet Recommendation NPO   Medication Administration: Via alternative means    Other  Recommendations Oral Care Recommendations: Oral care QID   Follow up Recommendations   (TBD)    Frequency and Duration min 2x/week  2 weeks       Prognosis Prognosis for Safe Diet Advancement: Good      Swallow Study   General HPI: 76 yo white male with a  h/o type 2 DM and CVA (s/p 7 yrs)with residual right-sided weakness who presents via EMS with SOB, syncope and bradycardia and flu like symptoms for several days. Pt found to have NSTEMI, acute renal failure , hyperkalemia, sepsis. Pt did not require intubation. CXR congestive heart failure with pulmonary interstitial edema and small right pleural effusion. Overall there has been interval improvement since yesterday's study. Pt' s wife states pt had dysphagia at time of stroke but having "difficulty swallowing over that past month." Type of Study: Bedside Swallow Evaluation Previous Swallow Assessment:  (MBS 2008, no results found) Diet Prior to this Study: NPO Temperature Spikes Noted: No Respiratory Status: Nasal cannula History of Recent Intubation: No Behavior/Cognition: Lethargic/Drowsy;Requires cueing;Doesn't follow directions Oral Cavity Assessment: Dry Oral Care Completed by SLP: Yes Oral Cavity - Dentition:  (will need to assess further) Self-Feeding Abilities: Total assist Patient Positioning: Upright in bed Baseline Vocal Quality: Low vocal intensity;Hoarse Volitional Cough: Weak (barely audible) Volitional Swallow: Unable to elicit    Oral/Motor/Sensory Function Overall Oral Motor/Sensory Function:  (pt unable )   Ice Chips Ice chips: Impaired Presentation: Spoon Oral Phase Impairments: Reduced labial seal;Reduced lingual movement/coordination Oral Phase Functional Implications: Prolonged oral transit Pharyngeal Phase Impairments: Suspected delayed Swallow;Decreased hyoid-laryngeal movement   Thin Liquid Thin Liquid: Impaired Presentation: Cup;Spoon Oral Phase Impairments: Reduced labial seal;Reduced lingual movement/coordination Oral Phase Functional Implications: Right anterior spillage;Left anterior spillage Pharyngeal  Phase Impairments: Suspected delayed Swallow;Cough - Immediate;Decreased hyoid-laryngeal movement    Nectar Thick Nectar Thick Liquid: Not tested   Honey  Thick Honey  Thick Liquid: Not tested   Puree Puree: Not tested   Solid   GO    Solid: Not tested       Royce MacadamiaLitaker, Keiran Gaffey Willis 09/30/2015,12:41 PM  Breck CoonsLisa Willis Lonell FaceLitaker M.Ed [employer]-SLP Pager [phone]

## 2015-09-30 NOTE — Progress Notes (Signed)
Speech Language Pathology :    Patient Details Name: Lockie Pareslfred K Kincheloe MRN: [id number] DOB: 20-Feb-1941 Today's Date: 09/30/2015 Time: [id number] SLP Time Calculation (min) (ACUTE ONLY): 13 min    BSE completed. Full evaluation will be written. Recommendation: Continue NPO. Needs objective assessment however not appropriate/ready for MBS/FEES due to decreased alertness (Haldol early this am), wheezing, significantly increased work of breathing. Consider alternate means nutrition. Will have ST check on tomorrow. Please try to refrain from sedation if possible so pt will be able to participate.    Breck CoonsLisa Willis VictoriaLitaker M.Ed [employer]CCC-SLP Pager [phone]

## 2015-09-30 NOTE — Progress Notes (Signed)
PULMONARY / CRITICAL CARE MEDICINE   Name: [name] MRN: [id number] DOB: 1941-08-18    ADMISSION DATE:  Oct 10, 2015   CONSULTATION DATE:  2015-10-10  REFERRING MD:  ED  CHIEF COMPLAINT:  Shortness of breath  HISTORY OF PRESENT ILLNESS:   This is a 75 yo white male with a h/o Type 2 DM and CVA  With residual right-sided weakness who presents via EMS with SOB, syncope and bradycardia. History is obtained from ED records as patient does not recall what brought him to the hospital. EMS was called for acute onset shortness of breath. Patient had flulike symptoms, abdominal pain, nausea, diarrhea and generalized malaise for several days. Last night patient became SOB. This afternoon, symptoms got worse and EMS was called. U[pon EMS arrival, patient was found to be tachypneic and had a brief syncopal episode and bradycardia with HR in the 30s as he was being transferred to the hospital. He was given 1 mg atropine and HR improved to the 70s. Enroute to the ED, he had another episode of bradycardia but HR improved.  His blood glucose in the field was in the 500s. Patient is currently alert. He denies pain, sob, chest pain, nausea and vomiting.  In the ED, patient was hypotensive, severely hyperglycemic,  his troponin was elevated and he had some EKG changes.    PAST MEDICAL HISTORY :  He  has a past medical history of Diabetes mellitus without complication (HCC) and Stroke (HCC).  PAST SURGICAL HISTORY: He  has no past surgical history on file.  No Known Allergies  No current facility-administered medications on file prior to encounter.   No current outpatient prescriptions on file prior to encounter.    FAMILY HISTORY:  His has no family status information on file.   SOCIAL HISTORY: He  reports that he has never smoked. He does not have any smokeless tobacco history on file. He reports that he does not drink alcohol.   SUBJECTIVE:  Remains encephalopathic today with delirium and  agitation.  VITAL SIGNS: BP 106/66 mmHg  Pulse 85  Temp(Src) 97.7 F (36.5 C) (Oral)  Resp 24  Ht 6\' 1"  (1.854 m)  Wt 92.2 kg (203 lb 4.2 oz)  BMI 26.82 kg/m2  SpO2 100%  HEMODYNAMICS:    VENTILATOR SETTINGS:    INTAKE / OUTPUT: UOP 1.1 L yesterday   PHYSICAL EXAMINATION: General: Chronically ill appearing male. Somnolent, arousable.  HEENT: Trachea midline Cardiovascular: RRR, no MRG. Lungs: Mild resp distress, diffuse expiratory wheezing Abdomen: Obese, soft, NT, normal bowel sounds Musculoskeletal: No joint deformities Skin: Intact  LABS:  BMET  Recent Labs Lab 09/29/15 0105 09/29/15 0300 09/30/15 0305  NA 143 144 144  K 4.2 4.2 3.6  CL 111 111 111  CO2 18* 19* 20*  BUN 61* 63* 72*  CREATININE 3.03* 2.80* 2.58*  GLUCOSE 85 69 112*    Electrolytes  Recent Labs Lab 09/28/15 0500  09/29/15 0105 09/29/15 0300 09/30/15 0305  CALCIUM 9.2  < > 9.1 9.0 8.8*  MG 1.8  --   --  1.8 1.9  PHOS 2.9  --   --  3.7 3.9  < > = values in this interval not displayed.  CBC  Recent Labs Lab 09/28/15 0500 09/29/15 0300 09/30/15 0305  WBC 11.3* 14.4* 9.9  HGB 11.2* 11.4* 10.5*  HCT 32.4* 35.0* 32.3*  PLT 111* 155 133*    Coag's  Recent Labs Lab 10/10/15 1329  APTT 30  INR 1.37  Sepsis Markers  Recent Labs Lab 10/21/2015 1642  09/28/15 0500 09/28/15 1200 09/29/15 0300  LATICACIDVEN  --   < > 4.4* 3.6* 3.2*  PROCALCITON 0.26  --  0.51  --  0.51  < > = values in this interval not displayed.  ABG  Recent Labs Lab 09/29/15 0619 09/30/15 0435  PHART 7.436 7.427  PCO2ART 24.8* 28.6*  PO2ART 63.3* 96.6    Liver Enzymes  Recent Labs Lab 10/24/2015 1329 09/29/15 0300 09/30/15 0305  AST 111* 596* 286*  ALT 89* 693* 578*  ALKPHOS 52 52 49  BILITOT 3.0* 1.6* 1.1  ALBUMIN 3.2* 2.9* 3.0*    Cardiac Enzymes  Recent Labs Lab 09/29/15 0946 09/29/15 1541 09/29/15 2123  TROPONINI 30.52* 28.81* 28.78*    Glucose  Recent  Labs Lab 09/30/15 0004 09/30/15 0033 09/30/15 0109 09/30/15 0324 09/30/15 0421 09/30/15 0538  GLUCAP 67 149* 157* 93 84 84    Imaging Dg Chest Port 1 View  09/30/2015  CLINICAL DATA:  Acute respiratory failure, DKA, nonsmoker. EXAM: PORTABLE CHEST 1 VIEW COMPARISON:  Portable chest x-ray of September 29, 2015 FINDINGS: The lungs are adequately inflated. The hemidiaphragms are better in flat demonstrated today. The pulmonary interstitial markings remain increased. The cardiac silhouette remains enlarged. The pulmonary vascularity remains engorged. There may be a small amount of pleural fluid at the right lung base. IMPRESSION: Congestive heart failure with pulmonary interstitial edema and small right pleural effusion. Overall there has been interval improvement since yesterday's study. Electronically Signed   By: David  [place] M.D.   On: 09/30/2015 07:37   US Abdomen Limited Ruq  09/29/2015  CLINICAL DATA:  Elevated liver function studies EXAM: US ABDOMEN LIMITED - RIGHT UPPER QUADRANT COMPARISON:  CT scan 10/19/2015 FINDINGS: Gallbladder: No gallstones or wall thickening visualized. No sonographic Murphy sign noted by sonographer. Common bile duct: Diameter: 5.7 mm Liver: There is diffuse increased echogenicity of the liver and decreased through transmission consistent with fatty infiltration. No focal lesions or biliary dilatation. IMPRESSION: 1. Diffuse fatty infiltration of the liver but no focal hepatic lesions or intrahepatic biliary dilatation. 2. Normal gallbladder and normal caliber common bile duct. 3. Right pleural effusion. Electronically Signed   By: Rudie Meyer M.D.   On: 09/29/2015 17:05   STUDIES:  01/03 CT abdomen and pelvis>> Small bilateral effusions, Hepatic steatosis. 1/4 CT head >> No acute finding 1/4/ EEG >> Diffuse slowing, encephalopathy, no seizures. 1/5 RUQ Ab US>> Diffuse fatty infiltration, no focal lesions or intrabillary dilation. Normal gallbladder and CBD.  Right  pleural effusion.    CULTURES: 01/03 Blood >> GPC 1/2 coag neg staph  1/03 Urine >> NGTD 1/04 Blood >> NGTD  ANTIBIOTICS: Zosyn 1/3>> Vanco 1/5 >>  SIGNIFICANT EVENTS: 01/03>>ED with SOB, bradycardia, DKA  and elevated troponin.  LINES/TUBES: PIVs  DISCUSSION: 75 yo male presenting with DKA, ARF,  transient syncopal episodes secondary to bradycardia and elevated troponin. Now in multiorgan failure with elevated LFTs, AKI, and delirium, encephalopathy.  PVR yesterday of 2L, had a BM.  Still agitated.   ASSESSMENT / PLAN:  PULMONARY A: Pulmonary edema and small right pleural effusion  P:   Supplemental O2 to keep O2 saturation> 90% BiPAP prn  CARDIOVASCULAR A:  Bradycardia NSTEMI HFrEF (Limited Echo with LVEF 20-25% with diffuse hypokinesis and akinesis of inferolateral wall).  Moderate MV regurg.  Trivial effusion.  P:  Cardiology following, appreciate recs ASA, on heparin gtt anticoagulation Not a candidate for cath now Lasix  RENAL  A:   Acute renal failure (UOP 1.1 L yesterday), BUN rising now 72 Hyperkalemia >> resolved P:   Will need to consult renal  Strict I/O  GASTROINTESTINAL A:   Nausea and diarrhea (per patient's wife) Elevated LFTs - maybe shock liver (US with fatty infiltration), now mild improvement  P:   CT abdomen is OK. RUQ US per above  NPO  HEMATOLOGIC A:   No acute issues P:  Monitor CBC  INFECTIOUS A:   Sepsis, GPC bacteremia Leukocytosis - improving  P:   F/U cultures Empiric antibiotics, zosyn/vancomycin  Follow procalcitonin  ENDOCRINE A:   DKA - resolved  Type 2 DM P:   Off insulin drip. SSI.  NEUROLOGIC A:   Metabolic encephalopathy H/O CVA P:   Neuro checks ASA Will need swallow eval  FAMILY  - Updates: Wife and son updated at bedside 1/5. Explained that he is in multiorgan failure likely from severe acidosis, metabolic abnormalities, possible sepsis. Prognosis is looking increasingly grim. That  understand. He has a living will and code status is DNR/DNI. OK for supplemental O2 and NIV. - Inter-disciplinary family meet or Palliative Care meeting due by:  day 7  Boykin PeekJacquelyn Chablis Losh, MD Internal Medicine, PGY-3 09/30/2015, 8:56 AM

## 2015-09-30 NOTE — Care Management Important Message (Signed)
Important Message  Patient Details  Name: [name] MRN: [id number] Date of Birth: 1941-03-12   Medicare Important Message Given:  Yes    Emmelyn Schmale P Dushaun Okey 09/30/2015, 2:19 PM

## 2015-10-01 ENCOUNTER — Inpatient Hospital Stay (HOSPITAL_COMMUNITY): Payer: Medicare Other

## 2015-10-01 DIAGNOSIS — J9601 Acute respiratory failure with hypoxia: Secondary | ICD-10-CM

## 2015-10-01 DIAGNOSIS — I214 Non-ST elevation (NSTEMI) myocardial infarction: Secondary | ICD-10-CM | POA: Diagnosis present

## 2015-10-01 LAB — GLUCOSE, CAPILLARY
GLUCOSE-CAPILLARY: 128 mg/dL — AB (ref 65–99)
GLUCOSE-CAPILLARY: 186 mg/dL — AB (ref 65–99)
GLUCOSE-CAPILLARY: 285 mg/dL — AB (ref 65–99)
Glucose-Capillary: 167 mg/dL — ABNORMAL HIGH (ref 65–99)
Glucose-Capillary: 278 mg/dL — ABNORMAL HIGH (ref 65–99)

## 2015-10-01 LAB — BASIC METABOLIC PANEL
ANION GAP: 13 (ref 5–15)
BUN: 65 mg/dL — ABNORMAL HIGH (ref 6–20)
CALCIUM: 8.5 mg/dL — AB (ref 8.9–10.3)
CO2: 21 mmol/L — AB (ref 22–32)
CREATININE: 2.26 mg/dL — AB (ref 0.61–1.24)
Chloride: 110 mmol/L (ref 101–111)
GFR calc non Af Amer: 26 mL/min — ABNORMAL LOW (ref >60)
GFR, EST AFRICAN AMERICAN: 31 mL/min — AB (ref >60)
Glucose, Bld: 187 mg/dL — ABNORMAL HIGH (ref 65–99)
Potassium: 3.5 mmol/L (ref 3.5–5.1)
SODIUM: 144 mmol/L (ref 135–145)

## 2015-10-01 LAB — HEPARIN LEVEL (UNFRACTIONATED): Heparin Unfractionated: <0.1 IU/mL — ABNORMAL LOW (ref 0.30–0.70)

## 2015-10-01 LAB — CBC
HCT: 29.9 % — ABNORMAL LOW (ref 39.0–52.0)
HEMOGLOBIN: 9.7 g/dL — AB (ref 13.0–17.0)
MCH: 31.4 pg (ref 26.0–34.0)
MCHC: 32.4 g/dL (ref 30.0–36.0)
MCV: 96.8 fL (ref 78.0–100.0)
PLATELETS: 138 10*3/uL — AB (ref 150–400)
RBC: 3.09 MIL/uL — AB (ref 4.22–5.81)
RDW: 13.9 % (ref 11.5–15.5)
WBC: 8.3 10*3/uL (ref 4.0–10.5)

## 2015-10-01 LAB — MAGNESIUM: MAGNESIUM: 1.9 mg/dL (ref 1.7–2.4)

## 2015-10-01 LAB — BLOOD GAS, ARTERIAL
Acid-base deficit: 2.7 mmol/L — ABNORMAL HIGH (ref 0.0–2.0)
BICARBONATE: 20.4 meq/L (ref 20.0–24.0)
DRAWN BY: 398661
O2 CONTENT: 2 L/min
O2 Saturation: 97.1 %
PCO2 ART: 28.1 mmHg — AB (ref 35.0–45.0)
PH ART: 7.474 — AB (ref 7.350–7.450)
Patient temperature: 98.6
TCO2: 21.2 mmol/L (ref 0–100)
pO2, Arterial: 92.2 mmHg (ref 80.0–100.0)

## 2015-10-01 LAB — PHOSPHORUS: Phosphorus: 4 mg/dL (ref 2.5–4.6)

## 2015-10-01 LAB — PROCALCITONIN: PROCALCITONIN: 0.34 ng/mL

## 2015-10-01 LAB — LACTIC ACID, PLASMA
LACTIC ACID, VENOUS: 1.8 mmol/L (ref 0.5–2.0)
Lactic Acid, Venous: 1.6 mmol/L (ref 0.5–2.0)

## 2015-10-01 MED ORDER — RESOURCE THICKENUP CLEAR PO POWD
ORAL | Status: DC | PRN
  Administered 2015-10-02: 14:00:00 via ORAL
  Filled 2015-10-01 (×2): qty 125

## 2015-10-01 MED ORDER — FUROSEMIDE 10 MG/ML IJ SOLN
40.0000 mg | Freq: Once | INTRAMUSCULAR | Status: AC
Start: 2015-10-01 — End: 2015-10-01
  Administered 2015-10-01: 40 mg via INTRAVENOUS
  Filled 2015-10-01: qty 4

## 2015-10-01 MED ORDER — HEPARIN SODIUM (PORCINE) 5000 UNIT/ML IJ SOLN
5000.0000 [IU] | Freq: Three times a day (TID) | INTRAMUSCULAR | Status: DC
  Administered 2015-10-01 – 2015-10-09 (×26): 5000 [IU] via SUBCUTANEOUS
  Filled 2015-10-01 (×26): qty 1

## 2015-10-01 MED ORDER — INSULIN ASPART 100 UNIT/ML ~~LOC~~ SOLN
0.0000 [IU] | Freq: Three times a day (TID) | SUBCUTANEOUS | Status: DC
  Administered 2015-10-01 – 2015-10-02 (×3): 8 [IU] via SUBCUTANEOUS
  Administered 2015-10-02 – 2015-10-03 (×3): 5 [IU] via SUBCUTANEOUS
  Administered 2015-10-03: 11 [IU] via SUBCUTANEOUS
  Administered 2015-10-04 (×3): 8 [IU] via SUBCUTANEOUS
  Administered 2015-10-05: 4 [IU] via SUBCUTANEOUS
  Administered 2015-10-05: 8 [IU] via SUBCUTANEOUS
  Administered 2015-10-05: 3 [IU] via SUBCUTANEOUS
  Administered 2015-10-06: 5 [IU] via SUBCUTANEOUS
  Administered 2015-10-06: 8 [IU] via SUBCUTANEOUS
  Administered 2015-10-06: 11 [IU] via SUBCUTANEOUS
  Administered 2015-10-07: 8 [IU] via SUBCUTANEOUS
  Administered 2015-10-07: 5 [IU] via SUBCUTANEOUS
  Administered 2015-10-07: 8 [IU] via SUBCUTANEOUS

## 2015-10-01 NOTE — Progress Notes (Signed)
PROGRESS NOTE  Subjective:   75 y.o. male with a history of Diabetes, proliferative diabetic retinopathy and stroke with residual right-sided weakness presented to [facility] ED by EMS 10/27/2015 for evaluation of shortness of breath. He was found to have DKA, severe hyperkalemia, bradycardia and + troponin levels. We were asked to comment on his + Troponin levels and ECG   Patient denies dyspnea or chest pain; appears dyspneic  Objective:    Vital Signs:   Temp:  [97 F (36.1 C)-98.8 F (37.1 C)] 97.5 F (36.4 C) (01/07 0737) Pulse Rate:  [28-94] 93 (01/07 0737) Resp:  [19-31] 23 (01/07 0737) BP: (85-130)/(44-86) 115/81 mmHg (01/07 0737) SpO2:  [93 %-100 %] 100 % (01/07 0737)      24-hour weight change: Weight change:   Weight trends: Filed Weights   October 27, 2015 1530 10/27/15 1800  Weight: 250 lb (113.399 kg) 203 lb 4.2 oz (92.2 kg)    Intake/Output:  01/06 0701 - 01/07 0700 In: 1122.5 [I.V.:1022.5; IV Piggyback:100] Out: 2140 [Urine:2140]     Physical Exam: BP 115/81 mmHg  Pulse 93  Temp(Src) 97.5 F (36.4 C) (Oral)  Resp 23  Ht 6\' 1"  (1.854 m)  Wt 203 lb 4.2 oz (92.2 kg)  BMI 26.82 kg/m2  SpO2 100%  Wt Readings from Last 3 Encounters:  10/27/2015 203 lb 4.2 oz (92.2 kg)    General: Vital signs reviewed and noted. WD, chronically ill appearing, appears to be in mild respiratory distress  Head: Normal        Neck:  supple  Lungs:  Diminished throughout  Heart:  RRR  Abdomen:  Soft, non-tender, non-distended    Extremities: 1+ edema    Neurologic: Difficult to assess as pt not fully cooperative       Labs: BMET:  Recent Labs  09/30/15 0305 10/01/15 0601  NA 144 144  K 3.6 3.5  CL 111 110  CO2 20* 21*  GLUCOSE 112* 187*  BUN 72* 65*  CREATININE 2.58* 2.26*  CALCIUM 8.8* 8.5*  MG 1.9 1.9  PHOS 3.9 4.0    Liver function tests:  Recent Labs  09/29/15 0300 09/30/15 0305  AST 596* 286*  ALT 693* 578*  ALKPHOS 52 49    BILITOT 1.6* 1.1  PROT 6.2* 5.8*  ALBUMIN 2.9* 3.0*   CBC:  Recent Labs  09/30/15 0305 10/01/15 0601  WBC 9.9 8.3  HGB 10.5* 9.7*  HCT 32.3* 29.9*  MCV 97.0 96.8  PLT 133* 138*    Cardiac Enzymes:  Recent Labs  09/29/15 2123 09/30/15 1020 09/30/15 1625 09/30/15 2059  TROPONINI 28.78* 26.19* 19.73* 18.77*    Coagulation Studies: No results for input(s): LABPROT, INR in the last 72 hours.    Other results:  EKG  ( personally reviewed )  -  NSR , RBBB   Medications:    Infusions: . sodium chloride Stopped (2015/10/27 1718)  . dextrose 50 mL/hr at 10/01/15 1610    Scheduled Medications: . aspirin  300 mg Rectal Daily  . dextrose  1 ampule Intravenous Once  . piperacillin-tazobactam (ZOSYN)  IV  3.375 g Intravenous Q8H  . vancomycin  1,000 mg Intravenous Q24H    Assessment/ Plan:   Active Problems:   DKA (diabetic ketoacidoses) (HCC)   Acute respiratory failure (HCC)  1. Non-ST elevation myocardial infarction-patient clearly ruled in during this hospitalization. Echocardiogram showed severely reduced LV function. However given his previous CVA, renal insufficiency and overall medical condition he  is not a candidate for aggressive evaluation such as cardiac catheterization. Continue aspirin. We will avoid statin for now given elevated liver functions. We will add this later as his LFTs improved. Patient was treated with 48 hours of heparin. This has now been discontinued. Add subcutaneous heparin. He is volume overloaded on examination and there is pulmonary edema on chest x-ray. We will diurese and follow renal function. I will not add an ACE inhibitor given severity of renal insufficiency. We will add hydralazine/nitrates later as blood pressure allows. No beta blocker for now in the setting of acute congestive heart failure. 2 acute systolic congestive heart failure/ICM-as above patient is volume overloaded. Diurese and follow renal function. No ACE inhibitor  given renal insufficiency. Add hydralazine and nitrates later as tolerated. No beta blocker for now. We will add later as tolerated. 3 diabetic ketoacidosis-management per critical care medicine. 4 elevated liver functions-Likely from congestion or shock liver. Improving. 5 prior CVA 6 acute renal failure-follow renal function closely with diuresis. Prognosis appears to be poor long-term. Palliative care consult is planned. Patient is a no CODE BLUE.  Olga MillersBrian Vici Novick, MD, [facility] CenterFACC 10/01/2015, 8:06 AM

## 2015-10-01 NOTE — Progress Notes (Signed)
Speech Language Pathology Treatment: Dysphagia  Patient Details Name: Lockie Pareslfred K Ormand MRN: [id number] DOB: 1941/06/16 Today's Date: 10/01/2015 Time: [id number] SLP Time Calculation (min) (ACUTE ONLY): 22 min  Assessment / Plan / Recommendation Clinical Impression  Pt presents with suspected moderate oropharyngeal dysphagia. Pt much more alert this date, however continues with increased work of breathing though oxygen saturation remained stable with supplemental O2.  Pt confused and impulsive during PO trials, attempting to take large consecutive cup sips resulting in overt signs and symptoms of suspected reduced airway protection including immediate and delayed weak, reflexive coughing with thin and nectar thick liquids . Noted slow efforful mastication of solid PO. Puree trails without overt s/sx of aspiration. Recommend MBS prior to diet implementation. MBS planned this date. Continue NPO except for meds crushed in puree.    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 Pt found to have NSTEMI, acute renal failure , hyperkalemia, sepsis. Pt did not require intubation. CXR congestive heart failure with pulmonary interstitial edema and small right pleural effusion. Overall there has been interval improvement since yesterday's study. Pt' s wife states pt had dysphagia at time of stroke but having "difficulty swallowing over that past month."      SLP Plan  MBS     Recommendations  Diet recommendations: NPO (pending MBS results ) Medication Administration: Crushed with puree              Oral Care Recommendations: Oral care QID Plan: MBS  Marcene Duoshelsea Sumney MA, CCC-SLP Acute Care Speech Language Pathologist    Kennieth RadSumney, Joleigh Mineau E 10/01/2015, 8:18 AM

## 2015-10-01 NOTE — Progress Notes (Signed)
PULMONARY / CRITICAL CARE MEDICINE   Name: [name] MRN: [id number] DOB: 25-Aug-1941    ADMISSION DATE:  10/15/2015   CONSULTATION DATE:  10/08/2015  REFERRING MD:  ED  CHIEF COMPLAINT:  Shortness of breath  HISTORY OF PRESENT ILLNESS:   This is a 75 yo white male with a h/o Type 2 DM and CVA  With residual right-sided weakness who presents via EMS with SOB, syncope and bradycardia. History is obtained from ED records as patient does not recall what brought him to the hospital. EMS was called for acute onset shortness of breath. Patient had flulike symptoms, abdominal pain, nausea, diarrhea and generalized malaise for several days. Last night patient became SOB. This afternoon, symptoms got worse and EMS was called. U[pon EMS arrival, patient was found to be tachypneic and had a brief syncopal episode and bradycardia with HR in the 30s as he was being transferred to the hospital. He was given 1 mg atropine and HR improved to the 70s. Enroute to the ED, he had another episode of bradycardia but HR improved.  His blood glucose in the field was in the 500s. Patient is currently alert. He denies pain, sob, chest pain, nausea and vomiting.  In the ED, patient was hypotensive, severely hyperglycemic,  his troponin was elevated and he had some EKG changes.     In summary , 75 Y/O presents with DKA, AKI, Elevated troponins (NSTEMI vs demand), elevated lactic acid, LFTs likely from shock liver and encephalopathy. Making slow improvements.  SUBJECTIVE:  More alert today Afebrile Denies pain   VITAL SIGNS: BP 115/81 mmHg  Pulse 93  Temp(Src) 97.5 F (36.4 C) (Oral)  Resp 23  Ht 6\' 1"  (1.854 m)  Wt 92.2 kg (203 lb 4.2 oz)  BMI 26.82 kg/m2  SpO2 100%  HEMODYNAMICS:    VENTILATOR SETTINGS:      PHYSICAL EXAMINATION: General: Chronically ill appearing male. Interactive when aroused HEENT: Trachea midline Cardiovascular: RRR, no MRG. Lungs: coarse BS BL  Abdomen: Obese, soft,  NT, normal bowel sounds Musculoskeletal: No joint deformities Skin: Intact , 1+ edema  LABS:  BMET  Recent Labs Lab 09/29/15 0300 09/30/15 0305 10/01/15 0601  NA 144 144 144  K 4.2 3.6 3.5  CL 111 111 110  CO2 19* 20* 21*  BUN 63* 72* 65*  CREATININE 2.80* 2.58* 2.26*  GLUCOSE 69 112* 187*    Electrolytes  Recent Labs Lab 09/29/15 0300 09/30/15 0305 10/01/15 0601  CALCIUM 9.0 8.8* 8.5*  MG 1.8 1.9 1.9  PHOS 3.7 3.9 4.0    CBC  Recent Labs Lab 09/29/15 0300 09/30/15 0305 10/01/15 0601  WBC 14.4* 9.9 8.3  HGB 11.4* 10.5* 9.7*  HCT 35.0* 32.3* 29.9*  PLT 155 133* 138*    Coag's  Recent Labs Lab 10/17/2015 1329  APTT 30  INR 1.37    Sepsis Markers  Recent Labs Lab 09/28/15 0500  09/29/15 0300 09/30/15 1020 10/01/15 0253 10/01/15 0601  LATICACIDVEN 4.4*  < > 3.2* 2.2* 1.8 1.6  PROCALCITON 0.51  --  0.51 0.38  --   --   < > = values in this interval not displayed.  ABG  Recent Labs Lab 09/29/15 0619 09/30/15 0435 10/01/15 0348  PHART 7.436 7.427 7.474*  PCO2ART 24.8* 28.6* 28.1*  PO2ART 63.3* 96.6 92.2    Liver Enzymes  Recent Labs Lab 09/30/2015 1329 09/29/15 0300 09/30/15 0305  AST 111* 596* 286*  ALT 89* 693* 578*  ALKPHOS 52 52 49  BILITOT 3.0* 1.6* 1.1  ALBUMIN 3.2* 2.9* 3.0*    Cardiac Enzymes  Recent Labs Lab 09/30/15 1020 09/30/15 1625 09/30/15 2059  TROPONINI 26.19* 19.73* 18.77*    Glucose  Recent Labs Lab 09/30/15 1353 09/30/15 1610 09/30/15 2001 09/30/15 2350 10/01/15 0408 10/01/15 0734  GLUCAP 149* 113* 119* 128* 167* 186*    Imaging Dg Chest Port 1 View  10/01/2015  CLINICAL DATA:  Acute respiratory failure EXAM: PORTABLE CHEST 1 VIEW COMPARISON:  Chest radiograph from one day prior. FINDINGS: Stable cardiomediastinal silhouette with mild cardiomegaly. No pneumothorax. No right pleural effusion. Possible small left pleural effusion (the left costophrenic angle is excluded from this image). There  are worsening severe diffuse parahilar hazy and linear opacities. IMPRESSION: Stable mild cardiomegaly with worsening severe diffuse parahilar hazy and linear opacities, favor severe pulmonary edema from worsening congestive heart failure. Electronically Signed   By: Delbert Phenix M.D.   On: 10/01/2015 07:15   STUDIES:  01/03 CT abdomen and pelvis>> Small bilateral effusions, Hepatic steatosis. 1/4 CT head >> No acute finding 1/4/ EEG >> Diffuse slowing, encephalopathy, no seizures. 1/5 RUQ Ab US>> Diffuse fatty infiltration, no focal lesions or intrabillary dilation. Normal gallbladder and CBD.  Right pleural effusion.    CULTURES: 01/03 Blood >> GPC 1/2 coag neg staph  1/03 Urine >> NGTD 1/04 Blood >> NGTD  ANTIBIOTICS: Zosyn 1/3>> Vanco 1/5 >>1/7   SIGNIFICANT EVENTS: 01/03>>ED with SOB, bradycardia, DKA  and elevated troponin.  LINES/TUBES: PIVs  DISCUSSION: 75 yo male presenting with DKA, ARF,  transient syncopal episodes secondary to bradycardia and elevated troponin. Now in multiorgan failure with elevated LFTs, AKI, and delirium, encephalopathy.  PVR yesterday of 2L, had a BM.  Still agitated.   ASSESSMENT / PLAN:  PULMONARY A: Pulmonary edema and small right pleural effusion  P:   Supplemental O2 to keep O2 saturation> 90% BiPAP prn Diurese as able  CARDIOVASCULAR A:  Bradycardia NSTEMI HFrEF (Limited Echo with LVEF 20-25% with diffuse hypokinesis and akinesis of inferolateral wall).  Moderate MV regurg.  Trivial effusion.  P:  Cardiology following, appreciate recs ASA, off heparin gtt a Not a candidate for cath  Lasix Hydrallazine + nitrates  - cannot add ACE due tor enal function  RENAL A:   Acute renal failure (UOP 1.1 L yesterday), BUN rising now 72 Hyperkalemia >> resolved P:   Diurese as able Strict I/O  GASTROINTESTINAL A:   Nausea and diarrhea (per patient's wife) Elevated LFTs - maybe shock liver ([place] with fatty infiltration), now mild  improvement  P:   CT abdomen  OK. RUQ [place] per above  NPO - await MBS   HEMATOLOGIC A:   No acute issues P:  Monitor CBC  INFECTIOUS A:   Sepsis - no clear source Leukocytosis - improving  P:   F/U cultures Dc  zosyn/vancomycin  Follow procalcitonin  ENDOCRINE A:   DKA - resolved  Type 2 DM P:   Off insulin drip. SSI.  NEUROLOGIC A:   Metabolic encephalopathy H/O CVA P:   Neuro checks ASA Avoid haldol  PT consult   FAMILY  - Updates: Wife and son updated at bedside 1/5. Explained that he is in multiorgan failure likely from severe acidosis, metabolic abnormalities, possible sepsis. Prognosis ipoor long term He has a living will and code status is DNR/DNI. OK for supplemental O2 and NIV. - Inter-disciplinary family meet or Palliative Care meeting due by:  day 7  Transfer to SDU  The patient  is critically ill with multiple organ systems failure and requires high complexity decision making for assessment and support, frequent evaluation and titration of therapies, application of advanced monitoring technologies and extensive interpretation of multiple databases. Critical Care Time devoted to patient care services described in this note independent of APP time is 31 minutes.  Oretha Milch MD   10/01/2015, 8:56 AM

## 2015-10-01 NOTE — Progress Notes (Signed)
MBS report now available under the imaging section of the notes  Gerrett Loman Sumney MA, CCC-SLP Acute Care Speech Language Pathologist    

## 2015-10-02 ENCOUNTER — Other Ambulatory Visit: Payer: Self-pay

## 2015-10-02 DIAGNOSIS — E0810 Diabetes mellitus due to underlying condition with ketoacidosis without coma: Secondary | ICD-10-CM

## 2015-10-02 LAB — BASIC METABOLIC PANEL
Anion gap: 14 (ref 5–15)
BUN: 66 mg/dL — ABNORMAL HIGH (ref 6–20)
CHLORIDE: 108 mmol/L (ref 101–111)
CO2: 19 mmol/L — AB (ref 22–32)
Calcium: 8.7 mg/dL — ABNORMAL LOW (ref 8.9–10.3)
Creatinine, Ser: 2.22 mg/dL — ABNORMAL HIGH (ref 0.61–1.24)
GFR calc Af Amer: 31 mL/min — ABNORMAL LOW (ref >60)
GFR calc non Af Amer: 27 mL/min — ABNORMAL LOW (ref >60)
GLUCOSE: 348 mg/dL — AB (ref 65–99)
Potassium: 3.6 mmol/L (ref 3.5–5.1)
Sodium: 141 mmol/L (ref 135–145)

## 2015-10-02 LAB — PROCALCITONIN: PROCALCITONIN: 0.35 ng/mL

## 2015-10-02 LAB — GLUCOSE, CAPILLARY
GLUCOSE-CAPILLARY: 272 mg/dL — AB (ref 65–99)
GLUCOSE-CAPILLARY: 237 mg/dL — AB (ref 65–99)
Glucose-Capillary: 330 mg/dL — ABNORMAL HIGH (ref 65–99)
Glucose-Capillary: 260 mg/dL — ABNORMAL HIGH (ref 65–99)
Glucose-Capillary: 215 mg/dL — ABNORMAL HIGH (ref 65–99)

## 2015-10-02 LAB — CBC
HCT: 33.5 % — ABNORMAL LOW (ref 39.0–52.0)
Hemoglobin: 10.7 g/dL — ABNORMAL LOW (ref 13.0–17.0)
MCH: 31.4 pg (ref 26.0–34.0)
MCHC: 31.9 g/dL (ref 30.0–36.0)
MCV: 98.2 fL (ref 78.0–100.0)
PLATELETS: 138 10*3/uL — AB (ref 150–400)
RBC: 3.41 MIL/uL — ABNORMAL LOW (ref 4.22–5.81)
RDW: 14.2 % (ref 11.5–15.5)
WBC: 10.2 10*3/uL (ref 4.0–10.5)

## 2015-10-02 LAB — CULTURE, BLOOD (ROUTINE X 2): Culture: NO GROWTH

## 2015-10-02 MED ORDER — DILTIAZEM LOAD VIA INFUSION
10.0000 mg | Freq: Once | INTRAVENOUS | Status: AC
  Administered 2015-10-02: 10 mg via INTRAVENOUS
  Filled 2015-10-02: qty 10

## 2015-10-02 MED ORDER — HYDRALAZINE HCL 10 MG PO TABS
10.0000 mg | ORAL_TABLET | Freq: Three times a day (TID) | ORAL | Status: DC
  Administered 2015-10-02 – 2015-10-09 (×23): 10 mg via ORAL
  Filled 2015-10-02 (×24): qty 1

## 2015-10-02 MED ORDER — FUROSEMIDE 10 MG/ML IJ SOLN
40.0000 mg | Freq: Every day | INTRAMUSCULAR | Status: DC
  Administered 2015-10-02: 40 mg via INTRAVENOUS
  Filled 2015-10-02: qty 4

## 2015-10-02 MED ORDER — INSULIN GLARGINE 100 UNIT/ML ~~LOC~~ SOLN
10.0000 [IU] | Freq: Every day | SUBCUTANEOUS | Status: DC
  Administered 2015-10-02 – 2015-10-04 (×3): 10 [IU] via SUBCUTANEOUS
  Filled 2015-10-02 (×3): qty 0.1

## 2015-10-02 MED ORDER — DILTIAZEM HCL 100 MG IV SOLR
5.0000 mg/h | INTRAVENOUS | Status: DC
  Administered 2015-10-02 – 2015-10-03 (×2): 5 mg/h via INTRAVENOUS
  Filled 2015-10-02 (×2): qty 100

## 2015-10-02 MED ORDER — POTASSIUM CHLORIDE CRYS ER 20 MEQ PO TBCR
20.0000 meq | EXTENDED_RELEASE_TABLET | Freq: Once | ORAL | Status: AC
  Administered 2015-10-02: 20 meq via ORAL
  Filled 2015-10-02: qty 1

## 2015-10-02 NOTE — Progress Notes (Signed)
PULMONARY / CRITICAL CARE MEDICINE   Name: [name] MRN: [id number] DOB: 07-Dec-1940    ADMISSION DATE:  10/24/2015   CONSULTATION DATE:  10/14/2015  REFERRING MD:  ED  CHIEF COMPLAINT:  Shortness of breath  HISTORY OF PRESENT ILLNESS:   This is a 75 yo white male with a h/o Type 2 DM and CVA  With residual right-sided weakness who presents via EMS with SOB, syncope and bradycardia. Patient had flulike symptoms, abdominal pain, nausea, diarrhea and generalized malaise for several days.  He was given 1 mg atropine and HR improved to the 70s.  In the ED, patient was hypotensive, severely hyperglycemic,  his troponin was elevated and he had some EKG changes.     In summary , 75 Y/O presents with DKA, AKI, Elevated troponins (NSTEMI vs demand), elevated lactic acid, LFTs likely from shock liver and encephalopathy. Making slow improvements.  SUBJECTIVE:  Afebrile Denies pain   VITAL SIGNS: BP 121/80 mmHg  Pulse 76  Temp(Src) 97.3 F (36.3 C) (Oral)  Resp 32  Ht 6\' 1"  (1.854 m)  Wt 92.2 kg (203 lb 4.2 oz)  BMI 26.82 kg/m2  SpO2 98%  HEMODYNAMICS:    VENTILATOR SETTINGS: Vent Mode:  [-]  FiO2 (%):  [2 %] 2 %    PHYSICAL EXAMINATION: General: Chronically ill appearing male. Interactive when aroused HEENT: Trachea midline Cardiovascular: RRR, no MRG. Lungs: coarse BS BL  Abdomen: Obese, soft, NT, normal bowel sounds Musculoskeletal: No joint deformities Skin: Intact , 1+ edema  LABS:  BMET  Recent Labs Lab 09/30/15 0305 10/01/15 0601 10/02/15 0235  NA 144 144 141  K 3.6 3.5 3.6  CL 111 110 108  CO2 20* 21* 19*  BUN 72* 65* 66*  CREATININE 2.58* 2.26* 2.22*  GLUCOSE 112* 187* 348*    Electrolytes  Recent Labs Lab 09/29/15 0300 09/30/15 0305 10/01/15 0601 10/02/15 0235  CALCIUM 9.0 8.8* 8.5* 8.7*  MG 1.8 1.9 1.9  --   PHOS 3.7 3.9 4.0  --     CBC  Recent Labs Lab 09/30/15 0305 10/01/15 0601 10/02/15 0235  WBC 9.9 8.3 10.2  HGB  10.5* 9.7* 10.7*  HCT 32.3* 29.9* 33.5*  PLT 133* 138* 138*    Coag's  Recent Labs Lab 10/18/2015 1329  APTT 30  INR 1.37    Sepsis Markers  Recent Labs Lab 09/30/15 1020 10/01/15 0253 10/01/15 0601 10/02/15 0235  LATICACIDVEN 2.2* 1.8 1.6  --   PROCALCITON 0.38  --  0.34 0.35    ABG  Recent Labs Lab 09/29/15 0619 09/30/15 0435 10/01/15 0348  PHART 7.436 7.427 7.474*  PCO2ART 24.8* 28.6* 28.1*  PO2ART 63.3* 96.6 92.2    Liver Enzymes  Recent Labs Lab 09/30/2015 1329 09/29/15 0300 09/30/15 0305  AST 111* 596* 286*  ALT 89* 693* 578*  ALKPHOS 52 52 49  BILITOT 3.0* 1.6* 1.1  ALBUMIN 3.2* 2.9* 3.0*    Cardiac Enzymes  Recent Labs Lab 09/30/15 1020 09/30/15 1625 09/30/15 2059  TROPONINI 26.19* 19.73* 18.77*    Glucose  Recent Labs Lab 10/01/15 1152 10/01/15 1641 10/01/15 2215 10/02/15 0745 10/02/15 1149 10/02/15 1611  GLUCAP 278* 285* 330* 260* 272* 215*    Imaging No results found. STUDIES:  01/03 CT abdomen and pelvis>> Small bilateral effusions, Hepatic steatosis. 1/4 CT head >> No acute finding 1/4/ EEG >> Diffuse slowing, encephalopathy, no seizures. 1/5 RUQ Ab US>> Diffuse fatty infiltration, no focal lesions or intrabillary dilation. Normal gallbladder and CBD.  Right pleural effusion.    CULTURES: 01/03 Blood >> GPC 1/2 coag neg staph  1/03 Urine >> NGTD 1/04 Blood >> NGTD  ANTIBIOTICS: Zosyn 1/3>> Vanco 1/5 >>1/7   SIGNIFICANT EVENTS: 01/03>>ED with SOB, bradycardia, DKA  and elevated troponin.  LINES/TUBES: PIVs  DISCUSSION: [age] yo male presenting with DKA, ARF,  transient syncopal episodes secondary to bradycardia and elevated troponin. Now with elevated LFTs, AKI, and delirium, encephalopathy.    ASSESSMENT / PLAN:  PULMONARY A: Acute Pulmonary edema - resolved and small right pleural effusion  P:   Supplemental O2 to keep O2 saturation> 90% Diurese as able  CARDIOVASCULAR A:  Bradycardia NSTEMI HFrEF  (Limited Echo with LVEF 20-25% with diffuse hypokinesis and akinesis of inferolateral wall).  Moderate MV regurg.  Trivial effusion.  P:  Cardiology following, appreciate recs ASA, off heparin gtt a Not a candidate for cath  Lasix Hydrallazine + nitrates  - cannot add ACE due to poor  renal function  RENAL A:   Acute renal failure (UOP 1.1 L yesterday), BUN high Hyperkalemia >> resolved P:   Lasix 40 daily Strict I/O  GASTROINTESTINAL A:   Nausea and diarrhea (per patient's wife) Elevated LFTs - maybe shock liver ([place] with fatty infiltration), now mild improvement  P:   CT abdomen  OK. RUQ US per above  NPO - await MBS   HEMATOLOGIC A:   No acute issues P:  Monitor CBC  INFECTIOUS A:   Sepsis - no clear source Leukocytosis - improving  P:   F/U cultures Dc  zosyn/vancomycin  low procalcitonin  ENDOCRINE A:   DKA - resolved  Type 2 DM P:   Off insulin drip, add lantus 10 U SSI.  NEUROLOGIC A:   Metabolic encephalopathy H/O CVA P:   Neuro checks ASA Avoid haldol  PT consult   FAMILY  - Updates: Wife and son updated at bedside 1/5.  Prognosis poor long term He has a living will and code status is DNR/DNI. OK for supplemental O2 and NIV. - Inter-disciplinary family meet or Palliative Care meeting due by:    To [facility] 1/9  The patient is critically ill with multiple organ systems failure and requires high complexity decision making for assessment and support, frequent evaluation and titration of therapies, application of advanced monitoring technologies and extensive interpretation of multiple databases. Critical Care Time devoted to patient care services described in this note independent of APP time is 31 minutes.  Oretha MilchALVA,RAKESH V. MD   10/02/2015, 6:26 PM

## 2015-10-02 NOTE — Progress Notes (Signed)
PROGRESS NOTE  Subjective:   75 y.o. male with a history of Diabetes, proliferative diabetic retinopathy and stroke with residual right-sided weakness presented to [facility] ED by EMS Oct 14, 2015 for evaluation of shortness of breath. He was found to have DKA, severe hyperkalemia, bradycardia and + troponin levels. We were asked to comment on his + Troponin levels and ECG   Patient denies dyspnea or chest pain; appears dyspneic  Objective:    Vital Signs:   Temp:  [97.2 F (36.2 C)-97.9 F (36.6 C)] 97.3 F (36.3 C) (01/08 0747) Pulse Rate:  [84-112] 93 (01/08 0747) Resp:  [16-36] 36 (01/08 0747) BP: (104-134)/(69-95) 104/73 mmHg (01/08 0747) SpO2:  [96 %-100 %] 98 % (01/08 0747) FiO2 (%):  [2 %] 2 % (01/07 2000)  Last BM Date: 10/01/15   24-hour weight change: Weight change:   Weight trends: Filed Weights   Oct 14, 2015 1530 2015-10-14 1800  Weight: 250 lb (113.399 kg) 203 lb 4.2 oz (92.2 kg)    Intake/Output:  01/07 0701 - 01/08 0700 In: 910 [P.O.:320; I.V.:440; IV Piggyback:150] Out: 1320 [Urine:1320]     Physical Exam: BP 104/73 mmHg  Pulse 93  Temp(Src) 97.3 F (36.3 C) (Oral)  Resp 36  Ht 6\' 1"  (1.854 m)  Wt 203 lb 4.2 oz (92.2 kg)  BMI 26.82 kg/m2  SpO2 98%  Wt Readings from Last 3 Encounters:  10/14/2015 203 lb 4.2 oz (92.2 kg)    General: Vital signs reviewed and noted. WD, chronically ill appearing, appears to be in NAD  Head: Normal        Neck: supple  Lungs:  Diminished BS bases  Heart:  RRR  Abdomen:  Soft, non-tender, non-distended    Extremities: 1+ edema    Neurologic: Difficult to assess as pt not fully cooperative       Labs: BMET:  Recent Labs  09/30/15 0305 10/01/15 0601 10/02/15 0235  NA 144 144 141  K 3.6 3.5 3.6  CL 111 110 108  CO2 20* 21* 19*  GLUCOSE 112* 187* 348*  BUN 72* 65* 66*  CREATININE 2.58* 2.26* 2.22*  CALCIUM 8.8* 8.5* 8.7*  MG 1.9 1.9  --   PHOS 3.9 4.0  --     Liver function  tests:  Recent Labs  09/30/15 0305  AST 286*  ALT 578*  ALKPHOS 49  BILITOT 1.1  PROT 5.8*  ALBUMIN 3.0*   CBC:  Recent Labs  10/01/15 0601 10/02/15 0235  WBC 8.3 10.2  HGB 9.7* 10.7*  HCT 29.9* 33.5*  MCV 96.8 98.2  PLT 138* 138*    Cardiac Enzymes:  Recent Labs  09/29/15 2123 09/30/15 1020 09/30/15 1625 09/30/15 2059  TROPONINI 28.78* 26.19* 19.73* 18.77*       Other results:  EKG  ( personally reviewed )  -  NSR , RBBB   Medications:    Infusions: . sodium chloride Stopped (10/02/15 0616)    Scheduled Medications: . aspirin  300 mg Rectal Daily  . dextrose  1 ampule Intravenous Once  . heparin subcutaneous  5,000 Units Subcutaneous 3 times per day  . insulin aspart  0-15 Units Subcutaneous TID WC  . piperacillin-tazobactam (ZOSYN)  IV  3.375 g Intravenous Q8H    Assessment/ Plan:   Active Problems:   DKA (diabetic ketoacidoses) (HCC)   Acute respiratory failure (HCC)   NSTEMI (non-ST elevated myocardial infarction) (HCC)  1. Non-ST elevation myocardial infarction-patient clearly ruled in during this hospitalization.  Echocardiogram showed severely reduced LV function. However given his previous CVA, renal insufficiency and overall medical condition he is not a candidate for aggressive evaluation such as cardiac catheterization. Continue aspirin. We will avoid statin for now given elevated liver functions. We will add this later as his LFTs improved (recheck in AM). Patient was treated with 48 hours of heparin. This has now been changed to subcutaneous heparin. He is mildly volume overloaded on examination. Treat with lasix 40 mg IV daily and follow renal function. I will not add an ACE inhibitor given severity of renal insufficiency. We will add low dose hydralazine today and nitrates in AM if BP allows; No beta blocker for now in the setting of acute congestive heart failure. Can add later as BP allows. 2 acute systolic congestive heart  failure/ICM-as above patient is volume overloaded. Diurese and follow renal function. No ACE inhibitor given renal insufficiency. Add hydralazine today and nitrates later as tolerated by BP. No beta blocker for now. We will add later as tolerated. 3 diabetic ketoacidosis-management per critical care medicine. 4 elevated liver functions-Likely from congestion or shock liver. Improving. Recheck in AM. 5 prior CVA 6 acute renal failure-follow renal function closely with diuresis. Prognosis appears to be poor long-term. Palliative care consult is planned. Patient is a no CODE BLUE.  Olga MillersBrian Morenike Cuff, MD, [facility] CenterFACC 10/02/2015, 8:48 AM

## 2015-10-02 NOTE — Consult Note (Signed)
Pharmacy Antibiotic Follow-up Note  [name] is a 75 y.o. year-old male admitted on 10/20/2015.  The patient is currently on day 6 of 7 for possible intra-abdominal infection.  Assessment/Plan: This patient's current antibiotics will be continued without adjustments. Possibly discontinuing Zosyn today.  Temp (24hrs), Avg:97.7 F (36.5 C), Min:97.3 F (36.3 C), Max:97.9 F (36.6 C)   Recent Labs Lab 09/28/15 0500 09/29/15 0300 09/30/15 0305 10/01/15 0601 10/02/15 0235  WBC 11.3* 14.4* 9.9 8.3 10.2    Recent Labs Lab 09/29/15 0105 09/29/15 0300 09/30/15 0305 10/01/15 0601 10/02/15 0235  CREATININE 3.03* 2.80* 2.58* 2.26* 2.22*   Estimated Creatinine Clearance: 32 mL/min (by C-G formula based on Cr of 2.22).    No Known Allergies  Antimicrobials this admission: Vanc 1/5 >> 1/7 Zosyn 1/3 >>   Microbiology results: 1/6 BCx x2: NGTD  1/3 BCx x2 >> 1/2 CoNS final  1/4 UCx >> ngtd  MRSA PCR neg   Thank you for allowing pharmacy to be a part of this patient's care.  Greggory Stallionristy Reyes, PharmD Clinical Pharmacy Resident Pager # [phone] 10/02/2015 3:16 PM

## 2015-10-03 ENCOUNTER — Ambulatory Visit (HOSPITAL_COMMUNITY): Payer: Medicare Other

## 2015-10-03 ENCOUNTER — Inpatient Hospital Stay (HOSPITAL_COMMUNITY): Payer: Medicare Other

## 2015-10-03 DIAGNOSIS — R1310 Dysphagia, unspecified: Secondary | ICD-10-CM

## 2015-10-03 DIAGNOSIS — N183 Chronic kidney disease, stage 3 unspecified: Secondary | ICD-10-CM | POA: Diagnosis present

## 2015-10-03 DIAGNOSIS — R7401 Elevation of levels of liver transaminase levels: Secondary | ICD-10-CM | POA: Diagnosis present

## 2015-10-03 DIAGNOSIS — J9601 Acute respiratory failure with hypoxia: Secondary | ICD-10-CM

## 2015-10-03 DIAGNOSIS — R740 Nonspecific elevation of levels of transaminase and lactic acid dehydrogenase [LDH]: Secondary | ICD-10-CM

## 2015-10-03 DIAGNOSIS — I480 Paroxysmal atrial fibrillation: Secondary | ICD-10-CM | POA: Diagnosis present

## 2015-10-03 DIAGNOSIS — I5021 Acute systolic (congestive) heart failure: Secondary | ICD-10-CM | POA: Diagnosis present

## 2015-10-03 DIAGNOSIS — N179 Acute kidney failure, unspecified: Secondary | ICD-10-CM | POA: Diagnosis present

## 2015-10-03 LAB — URINALYSIS, ROUTINE W REFLEX MICROSCOPIC
BILIRUBIN URINE: NEGATIVE
GLUCOSE, UA: NEGATIVE mg/dL
Hgb urine dipstick: NEGATIVE
KETONES UR: 15 mg/dL — AB
Leukocytes, UA: NEGATIVE
Nitrite: NEGATIVE
PH: 5.5 (ref 5.0–8.0)
Protein, ur: NEGATIVE mg/dL
SPECIFIC GRAVITY, URINE: 1.021 (ref 1.005–1.030)

## 2015-10-03 LAB — COMPREHENSIVE METABOLIC PANEL
ALBUMIN: 2.8 g/dL — AB (ref 3.5–5.0)
ALK PHOS: 61 U/L (ref 38–126)
ALT: 370 U/L — ABNORMAL HIGH (ref 17–63)
ANION GAP: 14 (ref 5–15)
AST: 115 U/L — ABNORMAL HIGH (ref 15–41)
BILIRUBIN TOTAL: 1.4 mg/dL — AB (ref 0.3–1.2)
BUN: 73 mg/dL — ABNORMAL HIGH (ref 6–20)
CALCIUM: 8.8 mg/dL — AB (ref 8.9–10.3)
CO2: 22 mmol/L (ref 22–32)
Chloride: 110 mmol/L (ref 101–111)
Creatinine, Ser: 2.47 mg/dL — ABNORMAL HIGH (ref 0.61–1.24)
GFR, EST AFRICAN AMERICAN: 28 mL/min — AB (ref >60)
GFR, EST NON AFRICAN AMERICAN: 24 mL/min — AB (ref >60)
Glucose, Bld: 248 mg/dL — ABNORMAL HIGH (ref 65–99)
Potassium: 3.5 mmol/L (ref 3.5–5.1)
Sodium: 146 mmol/L — ABNORMAL HIGH (ref 135–145)
TOTAL PROTEIN: 6.2 g/dL — AB (ref 6.5–8.1)

## 2015-10-03 LAB — CBC
HEMATOCRIT: 31 % — AB (ref 39.0–52.0)
HEMOGLOBIN: 10.2 g/dL — AB (ref 13.0–17.0)
MCH: 31.9 pg (ref 26.0–34.0)
MCHC: 32.9 g/dL (ref 30.0–36.0)
MCV: 96.9 fL (ref 78.0–100.0)
Platelets: 150 10*3/uL (ref 150–400)
RBC: 3.2 MIL/uL — AB (ref 4.22–5.81)
RDW: 14.6 % (ref 11.5–15.5)
WBC: 12.3 10*3/uL — AB (ref 4.0–10.5)

## 2015-10-03 LAB — GLUCOSE, CAPILLARY
GLUCOSE-CAPILLARY: 237 mg/dL — AB (ref 65–99)
GLUCOSE-CAPILLARY: 261 mg/dL — AB (ref 65–99)
Glucose-Capillary: 308 mg/dL — ABNORMAL HIGH (ref 65–99)
Glucose-Capillary: 245 mg/dL — ABNORMAL HIGH (ref 65–99)

## 2015-10-03 LAB — BRAIN NATRIURETIC PEPTIDE: B Natriuretic Peptide: 2538.6 pg/mL — ABNORMAL HIGH (ref 0.0–100.0)

## 2015-10-03 MED ORDER — ISOSORBIDE DINITRATE 10 MG PO TABS
10.0000 mg | ORAL_TABLET | Freq: Three times a day (TID) | ORAL | Status: DC
  Administered 2015-10-03 – 2015-10-09 (×19): 10 mg via ORAL
  Filled 2015-10-03 (×19): qty 1

## 2015-10-03 MED ORDER — FUROSEMIDE 40 MG PO TABS
40.0000 mg | ORAL_TABLET | Freq: Every day | ORAL | Status: DC
  Administered 2015-10-03 – 2015-10-04 (×2): 40 mg via ORAL
  Filled 2015-10-03 (×2): qty 1

## 2015-10-03 NOTE — Care Management Important Message (Signed)
Important Message  Patient Details  Name: Lockie Pareslfred K Slezak MRN: [id number] Date of Birth: December 06, 1940   Medicare Important Message Given:  Yes    Rayvon CharSTUTTS, Daanish Copes G 10/03/2015, 12:34 PMImportant Message  Patient Details  Name: Lockie Pareslfred K Ericksen MRN: [id number] Date of Birth: December 06, 1940   Medicare Important Message Given:  Yes    Tonianne Fine, Sula SodaINA G 10/03/2015, 12:34 PM

## 2015-10-03 NOTE — Progress Notes (Signed)
PROGRESS NOTE  [name] WUJ:[id number] DOB: Jan 26, 1941 DOA: 09-28-2015 PCP: [name], MD  Interim Summary 75 year old male with a history of diabetes mellitus type 2, stroke with residual right hemiparesis presented on 28-Sep-2015 with shortness of breath, bradycardia and syncope. The patient had a history of diarrhea abdominal pain and generalized malaise for several days prior to admission. The patient was given atropine 1 mg with improvement of his heart rate. He was noted to be hypotensive and severely hyperglycemic in the emergency department. In addition, his troponins were elevated with EKG changes.  Assessment/Plan: Acute respiratory failure with hypoxia -Secondary to pulmonary edema/pleura effusions -stable on 3.5 L presently  Hypotension -multifactorial including volume depletion, cardiomyopathy, and sepsis -Blood culture suggests contaminant -Urinalysis without significant pyuria -Zosyn 1/2-->1/8 -vanco 1/3-->1/7 -improved -recheck CXR and UA with increase in WBC  DKA in DM2 -A1C -Initially presented with serum glucose 586 with anion gap 18 -Initially on insulin drip--> transitioned to subcutaneous insulin -Lantus 10 units started 10/02/15 pm -NovoLog sliding scale -monitor CBGs and adjust  NSTEMI -Echocardiogram--EF 20-25 percent, diffuse HK, AK inferior lateral wall, moderate MR -Troponin peaked at 30.52 -Initially started on heparin drip -Cardiology is following -Not a candidate for heart catheterization -Cannot start ACE due to /AKICKD -Continue hydralazine and nitrates -Cannot add statin secondary to increased LFTs -Appreciate cardiology follow-up -continue ASA  Acute systolic CHF/ischemic cardiomyopathy -Continue intravenous furosemide -Cannot start ACE secondary to renal failure -monitor renal function -remains clinically fluid overloaded  Paroxysmal atrial fibrillation -New onset 10/02/2015 with RVR -Converted back to  sinus rhythm -CHADSVASc = 7 -doubt he is a good AC candidate, but defer final decision to cardiology -started on diltiazem drip 10/02/15 -d/c diltiazem and start BB if cardiology agreeable  Dysphagia -Evaluated by speech therapy 09/30/2015-->NPO initially -10/01/15 MBS-->dysphagia 1 with nectar thickened  Acute metabolic encephalopathy -EEG with diffuse slowing, no seizures -09/28/2015 CT brain negative -improving but unclear baseline  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 sepsis. Elevated blood sugar. Diabetes. EXAM: CT CHEST, ABDOMEN AND PELVIS WITHOUT CONTRAST TECHNIQUE: Multidetector CT imaging of the chest, abdomen and pelvis was performed following the standard protocol without IV contrast. COMPARISON:  Plain film chest earlier today.  No prior CTs. FINDINGS: CT CHEST Mediastinum/Nodes: Aortic and branch vessel atherosclerosis. Tortuous descending thoracic aorta. Mild cardiomegaly. Multivessel coronary artery atherosclerosis. Air within the right atrium and jugular veins is likely iatrogenic. No mediastinal or definite hilar adenopathy, given limitations of unenhanced CT. Lungs/Pleura: Small bilateral pleural effusions. Multifactorial mild to moderate degradation. Motion and patient arm position (not raised above the head). Bibasilar dependent airspace disease. Musculoskeletal: No acute osseous abnormality. CT ABDOMEN AND PELVIS Hepatobiliary: Position and motion degradation continues into the upper abdomen. Grossly normal noncontrast appearance of the liver. The  gallbladder is hyper attenuating, but suboptimally evaluated. No biliary duct dilatation. Pancreas: Mild to moderate pancreatic atrophy, without duct dilatation or surrounding inflammation. Spleen: Normal in size, without focal abnormality. Adrenals/Urinary Tract: Normal adrenal glands. Mild bilateral renal cortical thinning. No renal calculi or hydronephrosis. No hydroureter or ureteric  calculi. No bladder calculi. Stomach/Bowel: Stomach is collapsed. Extensive colonic diverticulosis. Mild sigmoid wall thickening is likely due to muscular hypertrophy. Normal terminal ileum and appendix. Normal small bowel. Vascular/Lymphatic: Aortic and branch vessel atherosclerosis. No abdominopelvic adenopathy. Reproductive: Normal prostate. Other: No significant free fluid. Musculoskeletal: Lumbosacral spondylosis. IMPRESSION: 1. Degraded exam, secondary to motion, lack of IV contrast, and patient arm position, not raised above the head. 2. Small bilateral pleural effusions with adjacent bibasilar atelectasis. 3. Hepatic steatosis. 4. Hyper attenuation within the gallbladder, suboptimally evaluated. This could represent stones or sludge. If the patient has abdominal symptoms, consider ultrasound. 5. Atherosclerosis, including within the coronary arteries. Electronically Signed   By: Jeronimo Greaves M.D.   On: October 27, 2015 18:21   Ct Head Wo Contrast  09/28/2015  CLINICAL DATA:  Increase confusion, difficulty swallowing EXAM: CT HEAD WITHOUT CONTRAST TECHNIQUE: Contiguous axial images were obtained from the base of the skull through the vertex without intravenous contrast. COMPARISON:  05/03/2007 FINDINGS: No skull fracture is noted. There is mucosal thickening right maxillary sinus. The mastoid air cells are unremarkable. No intracranial hemorrhage, mass effect or midline shift. Moderate cerebral atrophy with progression from prior exam. There is periventricular and patchy subcortical white matter decreased attenuation consistent  with chronic small vessel ischemic changes. Small lacunar infarct is noted in left posterior corona radiata. No definite acute cortical infarction. No mass lesion is noted on this unenhanced scan. IMPRESSION: No acute intracranial abnormality. No definite acute cortical infarction. Moderate cerebral atrophy with progression from prior exam. Small lacunar infarct noted in left posterior corona radiata. Periventricular and patchy subcortical white matter decreased attenuation probable due to chronic small vessel ischemic changes. Electronically Signed   By: Natasha Mead M.D.   On: 09/28/2015 14:56   Ct Chest Wo Contrast  2015-10-27  CLINICAL DATA:  Short of breath. sepsis. Elevated blood sugar. Diabetes. EXAM: CT CHEST, ABDOMEN AND PELVIS WITHOUT CONTRAST TECHNIQUE: Multidetector CT imaging of the chest, abdomen and pelvis was performed following the standard protocol without IV contrast. COMPARISON:  Plain film chest earlier today.  No prior CTs. FINDINGS: CT CHEST Mediastinum/Nodes: Aortic and branch vessel atherosclerosis. Tortuous descending thoracic aorta. Mild cardiomegaly. Multivessel coronary artery atherosclerosis. Air within the right atrium and jugular veins is likely iatrogenic. No mediastinal or definite hilar adenopathy, given limitations of unenhanced CT. Lungs/Pleura: Small bilateral pleural effusions. Multifactorial mild to moderate degradation. Motion and patient arm position (not raised above the head). Bibasilar dependent airspace disease. Musculoskeletal: No acute osseous abnormality. CT ABDOMEN AND PELVIS Hepatobiliary: Position and motion degradation continues into the upper abdomen. Grossly normal noncontrast appearance of the liver. The gallbladder is hyper attenuating, but suboptimally evaluated. No biliary duct dilatation. Pancreas: Mild to moderate pancreatic atrophy, without duct dilatation or surrounding inflammation. Spleen: Normal in size, without focal abnormality. Adrenals/Urinary  Tract: Normal adrenal glands. Mild bilateral renal cortical thinning. No renal calculi or hydronephrosis. No hydroureter or ureteric calculi. No bladder calculi. Stomach/Bowel: Stomach is collapsed. Extensive colonic diverticulosis. Mild sigmoid wall thickening is likely due to muscular hypertrophy. Normal terminal ileum and appendix. Normal small bowel. Vascular/Lymphatic: Aortic and branch vessel atherosclerosis. No abdominopelvic adenopathy. Reproductive: Normal prostate. Other: No significant free fluid. Musculoskeletal: Lumbosacral spondylosis. IMPRESSION: 1. Degraded exam, secondary to motion, lack of IV contrast, and patient arm position, not raised above the head. 2. Small bilateral pleural effusions with adjacent bibasilar atelectasis. 3. Hepatic steatosis. 4. Hyper attenuation within the gallbladder, suboptimally evaluated. This could represent stones or sludge. If the patient has abdominal  symptoms, consider ultrasound. 5. Atherosclerosis, including within the coronary arteries. Electronically Signed   By: Jeronimo GreavesKyle  Talbot M.D.   On: 10/11/2015 18:21   Dg Chest Port 1 View  10/01/2015  CLINICAL DATA:  Acute respiratory failure EXAM: PORTABLE CHEST 1 VIEW COMPARISON:  Chest radiograph from one day prior. FINDINGS: Stable cardiomediastinal silhouette with mild cardiomegaly. No pneumothorax. No right pleural effusion. Possible small left pleural effusion (the left costophrenic angle is excluded from this image). There are worsening severe diffuse parahilar hazy and linear opacities. IMPRESSION: Stable mild cardiomegaly with worsening severe diffuse parahilar hazy and linear opacities, favor severe pulmonary edema from worsening congestive heart failure. Electronically Signed   By: Delbert PhenixJason A Poff M.D.   On: 10/01/2015 07:15   Dg Chest Port 1 View  09/30/2015  CLINICAL DATA:  Acute respiratory failure, DKA, nonsmoker. EXAM: PORTABLE CHEST 1 VIEW COMPARISON:  Portable chest x-ray of September 29, 2015 FINDINGS: The  lungs are adequately inflated. The hemidiaphragms are better in flat demonstrated today. The pulmonary interstitial markings remain increased. The cardiac silhouette remains enlarged. The pulmonary vascularity remains engorged. There may be a small amount of pleural fluid at the right lung base. IMPRESSION: Congestive heart failure with pulmonary interstitial edema and small right pleural effusion. Overall there has been interval improvement since yesterday's study. Electronically Signed   By: Juandaniel Manfredo  [place] M.D.   On: 09/30/2015 07:37   Dg Chest Port 1 View  09/29/2015  CLINICAL DATA:  Diabetes. EXAM: PORTABLE CHEST 1 VIEW COMPARISON:  CT 10/07/2015. FINDINGS: Cardiomegaly with diffuse bilateral pulmonary alveolar infiltrates consistent with congestive heart failure pulmonary edema. Small pleural effusions. No pneumothorax . No acute osseous abnormality . IMPRESSION: Congestive heart failure with pulmonary edema. Electronically Signed   By: Maisie Fushomas  Register   On: 09/29/2015 08:45   Dg Chest Port 1 View  10/04/2015  CLINICAL DATA:  Dyspnea, abdominal pain, body aches EXAM: PORTABLE CHEST 1 VIEW COMPARISON:  04/22/2015 FINDINGS: Cardiomediastinal silhouette is stable. No acute infiltrate or pleural effusion. No pulmonary edema. Bony thorax is unremarkable. IMPRESSION: No active disease. Electronically Signed   By: Natasha MeadLiviu  Pop M.D.   On: 10/07/2015 15:29   Dg Swallowing Func-speech Pathology  10/01/2015  Objective Swallowing Evaluation:   Patient Details Name: [name] MRN: [id number] Date of Birth: 03-13-1941 Today's Date: 10/01/2015 Time: SLP Start Time (ACUTE ONLY): 0750-SLP Stop Time (ACUTE ONLY): [id number] SLP Time Calculation (min) (ACUTE ONLY): 22 min Past Medical History: @PMH @ Past Surgical History: No past surgical history on file. HPI: 75 yo white male with a h/o type 2 DM and CVA (s/p 7 yrs)with residual right-sided weakness who presents via EMS with SOB, syncope and bradycardia and flu like symptoms  for several days. Pt found to have NSTEMI, acute renal failure , hyperkalemia, sepsis. Pt did not require intubation. CXR congestive heart failure with pulmonary interstitial edema and small right pleural effusion. Overall there has been interval improvement since yesterday's study. Pt' s wife states pt had dysphagia at time of stroke but having "difficulty swallowing over that past month." No Data Recorded Assessment / Plan / Recommendation CHL IP CLINICAL IMPRESSIONS 10/01/2015 Therapy Diagnosis Mild to moderate oropharyngeal dysphagia Clinical Impression Pt presents with mild to moderate sensory motor  oropharyngeal dysphagia. Note weak bolus manipulation and decreased lingual coordination with delay in swallow initiation across all consistencies. Aspiration evidenced inconsistently with thin liquids via cup sip that was sensed but not effectively cleared by pts weak reflexive cough. Mild vallecular residuals noted with  all PO which reduced with additonal swallow and did not impact airway protection. No aspiration or penetration observed with nectar thick liquids.  Pts decreased respiratory support and cognitive decline further  increase aspiration risk. Pt with notable work of breathing during MBS. Recommend dysphagia 1 ( puree) and nectar thick liquids with medicines whole in puree. Full supervision with all PO. ST to follow up and upgrade PO as clinically tolerated.  Impact on safety and function Moderate aspiration risk   CHL IP TREATMENT RECOMMENDATION 10/01/2015 Treatment Recommendations Therapy as outlined in treatment plan below   Prognosis 10/01/2015 Prognosis for Safe Diet Advancement Fair Barriers to Reach Goals Cognitive deficits;Severity of deficits Barriers/Prognosis Comment -- CHL IP DIET RECOMMENDATION 10/01/2015 SLP Diet Recommendations Dysphagia 2 (Fine chop) solids;Nectar thick liquid Liquid Administration via Cup Medication Administration Whole meds with puree Compensations Slow rate;Minimize  environmental distractions;Small sips/bites Postural Changes Remain semi-upright after after feeds/meals (Comment);Seated upright at 90 degrees   CHL IP OTHER RECOMMENDATIONS 10/01/2015 Recommended Consults -- Oral Care Recommendations Oral care BID Other Recommendations Order thickener from pharmacy   CHL IP FOLLOW UP RECOMMENDATIONS 10/01/2015 Follow up Recommendations 24 hour supervision/assistance   CHL IP FREQUENCY AND DURATION 10/01/2015 Speech Therapy Frequency (ACUTE ONLY) min 2x/week Treatment Duration 2 weeks      CHL IP ORAL PHASE 10/01/2015 Oral Phase Impaired Oral - Pudding Teaspoon -- Oral - Pudding Cup -- Oral - Honey Teaspoon -- Oral - Honey Cup -- Oral - Nectar Teaspoon -- Oral - Nectar Cup Weak lingual manipulation;Delayed oral transit Oral - Nectar Straw -- Oral - Thin Teaspoon Weak lingual manipulation;Delayed oral transit Oral - Thin Cup Weak lingual manipulation;Delayed oral transit Oral - Thin Straw Weak lingual manipulation;Delayed oral transit Oral - Puree Weak lingual manipulation;Delayed oral transit Oral - Mech Soft -- Oral - Regular Weak lingual manipulation;Delayed oral transit Oral - Multi-Consistency -- Oral - Pill -- Oral Phase - Comment --  CHL IP PHARYNGEAL PHASE 10/01/2015 Pharyngeal Phase Impaired Pharyngeal- Pudding Teaspoon -- Pharyngeal -- Pharyngeal- Pudding Cup -- Pharyngeal -- Pharyngeal- Honey Teaspoon -- Pharyngeal -- Pharyngeal- Honey Cup -- Pharyngeal -- Pharyngeal- Nectar Teaspoon -- Pharyngeal -- Pharyngeal- Nectar Cup Reduced tongue base retraction;Delayed swallow initiation-vallecula;Lateral channel residue;Pharyngeal residue - valleculae Pharyngeal -- Pharyngeal- Nectar Straw -- Pharyngeal -- Pharyngeal- Thin Teaspoon Delayed swallow initiation-vallecula;Pharyngeal residue - valleculae;Reduced tongue base retraction Pharyngeal -- Pharyngeal- Thin Cup Penetration/Aspiration before swallow;Moderate aspiration;Pharyngeal residue - valleculae;Reduced tongue base retraction  Pharyngeal Material enters airway, passes BELOW cords and not ejected out despite cough attempt by patient Pharyngeal- Thin Straw Delayed swallow initiation-vallecula;Penetration/Aspiration during swallow;Pharyngeal residue - valleculae;Reduced tongue base retraction Pharyngeal Material enters airway, CONTACTS cords and then ejected out Pharyngeal- Puree Pharyngeal residue - valleculae;Reduced tongue base retraction;Delayed swallow initiation-vallecula Pharyngeal -- Pharyngeal- Mechanical Soft -- Pharyngeal -- Pharyngeal- Regular Delayed swallow initiation-vallecula;Pharyngeal residue - valleculae;Reduced tongue base retraction Pharyngeal -- Pharyngeal- Multi-consistency -- Pharyngeal -- Pharyngeal- Pill -- Pharyngeal -- Pharyngeal Comment --  No flowsheet data found. Marcene Duos MA, CCC-SLP Acute Care Speech Language Pathologist  Kennieth Rad 10/01/2015, 2:05 PM              US Abdomen Limited Ruq  09/29/2015  CLINICAL DATA:  Elevated liver function studies EXAM: US ABDOMEN LIMITED - RIGHT UPPER QUADRANT COMPARISON:  CT scan 10/05/2015 FINDINGS: Gallbladder: No gallstones or wall thickening visualized. No sonographic Murphy sign noted by sonographer. Common bile duct: Diameter: 5.7 mm Liver: There is diffuse increased echogenicity of the liver and decreased through transmission consistent  with fatty infiltration. No focal lesions or biliary dilatation. IMPRESSION: 1. Diffuse fatty infiltration of the liver but no focal hepatic lesions or intrahepatic biliary dilatation. 2. Normal gallbladder and normal caliber common bile duct. 3. Right pleural effusion. Electronically Signed   By: Rudie Meyer M.D.   On: 09/29/2015 17:05        Subjective: Patient is awake and alert and answering questions properly but denies fevers, chills, headache, chest pain, shortness breath, abdominal pain, vomiting, diarrhea. Denies any rashes.  Objective: Filed Vitals:   10/03/15 0600 10/03/15 0602 10/03/15 0721  10/03/15 0800  BP: 134/71 134/71 138/79 142/76  Pulse: 79  77 81  Temp:   98.3 F (36.8 C)   TempSrc:   Oral   Resp: 26  24 29   Height:      Weight:      SpO2: 99%  97% 98%    Intake/Output Summary (Last 24 hours) at 10/03/15 0836 Last data filed at 10/03/15 0800  Gross per 24 hour  Intake 472.75 ml  Output   1115 ml  Net -642.25 ml   Weight change:  Exam:   General:  Pt is alert, follows commands appropriately, not in acute distress  HEENT: No icterus, No thrush, No neck mass, /AT  Cardiovascular: RRR, S1/S2, no rubs, no gallops  Respiratory: Bibasilar crackles but no wheezing. Good air movement  Abdomen: Soft/+BS, non tender, non distended, no guarding; no hepatosplenomegaly  Extremities: 1+LE R>L edema, No lymphangitis, No petechiae, No rashes, no synovitis  Data Reviewed: Basic Metabolic Panel:  Recent Labs Lab 2015-10-27 1642  09/28/15 0500  09/29/15 0300 09/30/15 0305 10/01/15 0601 10/02/15 0235 10/03/15 0310  NA 138  < > 145  < > 144 144 144 141 146*  K 6.4*  < > 3.9  < > 4.2 3.6 3.5 3.6 3.5  CL 106  < > 112*  < > 111 111 110 108 110  CO2 16*  < > 20*  < > 19* 20* 21* 19* 22  GLUCOSE 514*  < > 133*  < > 69 112* 187* 348* 248*  BUN 48*  < > 50*  < > 63* 72* 65* 66* 73*  CREATININE 3.09*  < > 3.15*  < > 2.80* 2.58* 2.26* 2.22* 2.47*  CALCIUM 9.4  < > 9.2  < > 9.0 8.8* 8.5* 8.7* 8.8*  MG 1.9  --  1.8  --  1.8 1.9 1.9  --   --   PHOS 4.6  --  2.9  --  3.7 3.9 4.0  --   --   < > = values in this interval not displayed. Liver Function Tests:  Recent Labs Lab 2015-10-27 1329 09/29/15 0300 09/30/15 0305 10/03/15 0310  AST 111* 596* 286* 115*  ALT 89* 693* 578* 370*  ALKPHOS 52 52 49 61  BILITOT 3.0* 1.6* 1.1 1.4*  PROT 6.0* 6.2* 5.8* 6.2*  ALBUMIN 3.2* 2.9* 3.0* 2.8*   No results for input(s): LIPASE, AMYLASE in the last 168 hours.  Recent Labs Lab 09/30/15 1020  AMMONIA 33   CBC:  Recent Labs Lab Oct 27, 2015 1642  09/29/15 0300  09/30/15 0305 10/01/15 0601 10/02/15 0235 10/03/15 0310  WBC 5.8  < > 14.4* 9.9 8.3 10.2 12.3*  NEUTROABS 4.8  --   --   --   --   --   --   HGB 11.3*  < > 11.4* 10.5* 9.7* 10.7* 10.2*  HCT 33.7*  < > 35.0* 32.3* 29.9*  33.5* 31.0*  MCV 96.3  < > 98.6 97.0 96.8 98.2 96.9  PLT 124*  < > 155 133* 138* 138* 150  < > = values in this interval not displayed. Cardiac Enzymes:  Recent Labs Lab 09/29/15 1541 09/29/15 2123 09/30/15 1020 09/30/15 1625 09/30/15 2059  TROPONINI 28.81* 28.78* 26.19* 19.73* 18.77*   BNP: Invalid input(s): POCBNP CBG:  Recent Labs Lab 10/02/15 0745 10/02/15 1149 10/02/15 1611 10/02/15 2144 10/03/15 0720  GLUCAP 260* 272* 215* 237* 237*    Recent Results (from the past 240 hour(s))  Blood Culture (routine x 2)     Status: None   Collection Time: 10/17/2015  4:42 PM  Result Value Ref Range Status   Specimen Description BLOOD LEFT HAND  Final   Special Requests IN PEDIATRIC BOTTLE 1CC  Final   Culture NO GROWTH 5 DAYS  Final   Report Status 10/02/2015 FINAL  Final  Blood Culture (routine x 2)     Status: None   Collection Time: 10/17/2015  4:58 PM  Result Value Ref Range Status   Specimen Description BLOOD LEFT FOREARM  Final   Special Requests BOTTLES DRAWN AEROBIC AND ANAEROBIC 5CC  Final   Culture  Setup Time   Final    GRAM POSITIVE COCCI IN CLUSTERS ANAEROBIC BOTTLE ONLY CRITICAL RESULT CALLED TO, READ BACK BY AND VERIFIED WITH: Candy Sledge RN 1530 09/28/15 A BROWNING    Culture   Final    STAPHYLOCOCCUS SPECIES (COAGULASE NEGATIVE) THE SIGNIFICANCE OF ISOLATING THIS ORGANISM FROM A SINGLE SET OF BLOOD CULTURES WHEN MULTIPLE SETS ARE DRAWN IS UNCERTAIN. PLEASE NOTIFY THE MICROBIOLOGY DEPARTMENT WITHIN ONE WEEK IF SPECIATION AND SENSITIVITIES ARE REQUIRED.    Report Status 09/30/2015 FINAL  Final  MRSA PCR Screening     Status: None   Collection Time: 10-17-2015  6:28 PM  Result Value Ref Range Status   MRSA by PCR NEGATIVE NEGATIVE Final     Comment:        The GeneXpert MRSA Assay (FDA approved for NASAL specimens only), is one component of a comprehensive MRSA colonization surveillance program. It is not intended to diagnose MRSA infection nor to guide or monitor treatment for MRSA infections.   Urine culture     Status: None   Collection Time: 09/28/15  7:16 PM  Result Value Ref Range Status   Specimen Description URINE, CLEAN CATCH  Final   Special Requests NONE  Final   Culture NO GROWTH 2 DAYS  Final   Report Status 09/30/2015 FINAL  Final  Culture, blood (Routine X 2) w Reflex to ID Panel     Status: None (Preliminary result)   Collection Time: 09/30/15 10:20 AM  Result Value Ref Range Status   Specimen Description BLOOD LEFT HAND  Final   Special Requests IN PEDIATRIC BOTTLE 2CC  Final   Culture NO GROWTH 2 DAYS  Final   Report Status PENDING  Incomplete  Culture, blood (Routine X 2) w Reflex to ID Panel     Status: None (Preliminary result)   Collection Time: 09/30/15 10:27 AM  Result Value Ref Range Status   Specimen Description BLOOD LEFT HAND  Final   Special Requests IN PEDIATRIC BOTTLE 3CC  Final   Culture NO GROWTH 2 DAYS  Final   Report Status PENDING  Incomplete     Scheduled Meds: . aspirin  300 mg Rectal Daily  . dextrose  1 ampule Intravenous Once  . furosemide  40 mg Intravenous Daily  .  heparin subcutaneous  5,000 Units Subcutaneous 3 times per day  . hydrALAZINE  10 mg Oral 3 times per day  . insulin aspart  0-15 Units Subcutaneous TID WC  . insulin glargine  10 Units Subcutaneous Daily   Continuous Infusions: . sodium chloride 10 mL/hr at 10/02/15 1337  . diltiazem (CARDIZEM) infusion 5 mg/hr (10/03/15 0141)     Savon Cobbs, DO  [facility] Pager [phone]  If 7PM-7AM, please contact night-coverage [email or web address] Password [email or web address] 10/03/2015, 8:36 AM   LOS: 6 days

## 2015-10-03 NOTE — Progress Notes (Addendum)
Patient Name: [name] Date of Encounter: 10/03/2015  Active Problems:   DKA (diabetic ketoacidoses) (HCC)   Acute respiratory failure (HCC)   NSTEMI (non-ST elevated myocardial infarction) (HCC)   Length of Stay: 6  SUBJECTIVE  75 y.o. male with a history of Diabetes, proliferative diabetic retinopathy and stroke with residual right-sided weakness presented to [facility] ED by EMS 2015-10-22 for evaluation of shortness of breath. He was found to have DKA, severe hyperkalemia, bradycardia and NSTEMI.Echo EF 20-25% with inferolateral akinesis (EF 60% in 2008).  Obtunded. Becomes a little more alert for feeding with assistance. Responds with grunts/moans and flickers of eyelids. Looks comfortable. No tachypnea. No weight since admission. + 1L net since admission.  CURRENT MEDS . aspirin  300 mg Rectal Daily  . dextrose  1 ampule Intravenous Once  . furosemide  40 mg Intravenous Daily  . heparin subcutaneous  5,000 Units Subcutaneous 3 times per day  . hydrALAZINE  10 mg Oral 3 times per day  . insulin aspart  0-15 Units Subcutaneous TID WC  . insulin glargine  10 Units Subcutaneous Daily    OBJECTIVE   Intake/Output Summary (Last 24 hours) at 10/03/15 0824 Last data filed at 10/03/15 0800  Gross per 24 hour  Intake 472.75 ml  Output   1115 ml  Net -642.25 ml   Filed Weights   10-22-2015 1530 Oct 22, 2015 1800  Weight: 250 lb (113.399 kg) 203 lb 4.2 oz (92.2 kg)    PHYSICAL EXAM Filed Vitals:   10/03/15 0600 10/03/15 0602 10/03/15 0721 10/03/15 0800  BP: 134/71 134/71 138/79 142/76  Pulse: 79  77 81  Temp:   98.3 F (36.8 C)   TempSrc:   Oral   Resp: 26  24 29   Height:      Weight:      SpO2: 99%  97% 98%   General: Obtunded, no distress Head: no evidence of trauma, PERRL, EOMI, no exophtalmos or lid lag, no myxedema, no xanthelasma; normal ears, nose and oropharynx, dry mucous membranes Neck: normal jugular venous pulsations and no hepatojugular  reflux; brisk carotid pulses without delay and no carotid bruits Chest: clear to auscultation, no signs of consolidation by percussion or palpation, normal fremitus, symmetrical and full respiratory excursions Cardiovascular: normal position and quality of the apical impulse, regular rhythm, normal first and second heart sounds, no rubs or gallops, no murmur Abdomen: no tenderness or distention, no masses by palpation, no abnormal pulsatility or arterial bruits, normal bowel sounds, no hepatosplenomegaly Extremities: no clubbing, cyanosis or edema; 2+ radial, ulnar and brachial pulses bilaterally;cannot locate pedal pulses Neurological: right hemiparesis, but very limited cooperation with exam  LABS  CBC  Recent Labs  10/02/15 0235 10/03/15 0310  WBC 10.2 12.3*  HGB 10.7* 10.2*  HCT 33.5* 31.0*  MCV 98.2 96.9  PLT 138* 150   Basic Metabolic Panel  Recent Labs  10/01/15 0601 10/02/15 0235 10/03/15 0310  NA 144 141 146*  K 3.5 3.6 3.5  CL 110 108 110  CO2 21* 19* 22  GLUCOSE 187* 348* 248*  BUN 65* 66* 73*  CREATININE 2.26* 2.22* 2.47*  CALCIUM 8.5* 8.7* 8.8*  MG 1.9  --   --   PHOS 4.0  --   --    Liver Function Tests  Recent Labs  10/03/15 0310  AST 115*  ALT 370*  ALKPHOS 61  BILITOT 1.4*  PROT 6.2*  ALBUMIN 2.8*   No results for input(s): LIPASE, AMYLASE in  the last 72 hours. Cardiac Enzymes  Recent Labs  09/30/15 1020 09/30/15 1625 09/30/15 2059  TROPONINI 26.19* 19.73* 18.77*    Radiology Studies Imaging results have been reviewed and Dg Swallowing Func-speech Pathology  10/01/2015  Objective Swallowing Evaluation:   Patient Details Name: [name] MRN: [id number] Date of Birth: Sep 24, 1941 Today's Date: 10/01/2015 Time: SLP Start Time (ACUTE ONLY): 0750-SLP Stop Time (ACUTE ONLY): 4098 SLP Time Calculation (min) (ACUTE ONLY): 22 min Past Medical History: @PMH @ Past Surgical History: No past surgical history on file. HPI: 75 yo white male with a  h/o type 2 DM and CVA (s/p 7 yrs)with residual right-sided weakness who presents via EMS with SOB, syncope and bradycardia and flu like symptoms for several days. Pt found to have NSTEMI, acute renal failure , hyperkalemia, sepsis. Pt did not require intubation. CXR congestive heart failure with pulmonary interstitial edema and small right pleural effusion. Overall there has been interval improvement since yesterday's study. Pt' s wife states pt had dysphagia at time of stroke but having "difficulty swallowing over that past month." No Data Recorded Assessment / Plan / Recommendation CHL IP CLINICAL IMPRESSIONS 10/01/2015 Therapy Diagnosis Mild to moderate oropharyngeal dysphagia Clinical Impression Pt presents with mild to moderate sensory motor  oropharyngeal dysphagia. Note weak bolus manipulation and decreased lingual coordination with delay in swallow initiation across all consistencies. Aspiration evidenced inconsistently with thin liquids via cup sip that was sensed but not effectively cleared by pts weak reflexive cough. Mild vallecular residuals noted with all PO which reduced with additonal swallow and did not impact airway protection. No aspiration or penetration observed with nectar thick liquids.  Pts decreased respiratory support and cognitive decline further  increase aspiration risk. Pt with notable work of breathing during MBS. Recommend dysphagia 1 ( puree) and nectar thick liquids with medicines whole in puree. Full supervision with all PO. ST to follow up and upgrade PO as clinically tolerated.  Impact on safety and function Moderate aspiration risk   CHL IP TREATMENT RECOMMENDATION 10/01/2015 Treatment Recommendations Therapy as outlined in treatment plan below   Prognosis 10/01/2015 Prognosis for Safe Diet Advancement Fair Barriers to Reach Goals Cognitive deficits;Severity of deficits Barriers/Prognosis Comment -- CHL IP DIET RECOMMENDATION 10/01/2015 SLP Diet Recommendations Dysphagia 2 (Fine chop)  solids;Nectar thick liquid Liquid Administration via Cup Medication Administration Whole meds with puree Compensations Slow rate;Minimize environmental distractions;Small sips/bites Postural Changes Remain semi-upright after after feeds/meals (Comment);Seated upright at 90 degrees   CHL IP OTHER RECOMMENDATIONS 10/01/2015 Recommended Consults -- Oral Care Recommendations Oral care BID Other Recommendations Order thickener from pharmacy   CHL IP FOLLOW UP RECOMMENDATIONS 10/01/2015 Follow up Recommendations 24 hour supervision/assistance   CHL IP FREQUENCY AND DURATION 10/01/2015 Speech Therapy Frequency (ACUTE ONLY) min 2x/week Treatment Duration 2 weeks      CHL IP ORAL PHASE 10/01/2015 Oral Phase Impaired Oral - Pudding Teaspoon -- Oral - Pudding Cup -- Oral - Honey Teaspoon -- Oral - Honey Cup -- Oral - Nectar Teaspoon -- Oral - Nectar Cup Weak lingual manipulation;Delayed oral transit Oral - Nectar Straw -- Oral - Thin Teaspoon Weak lingual manipulation;Delayed oral transit Oral - Thin Cup Weak lingual manipulation;Delayed oral transit Oral - Thin Straw Weak lingual manipulation;Delayed oral transit Oral - Puree Weak lingual manipulation;Delayed oral transit Oral - Mech Soft -- Oral - Regular Weak lingual manipulation;Delayed oral transit Oral - Multi-Consistency -- Oral - Pill -- Oral Phase - Comment --  CHL IP PHARYNGEAL PHASE 10/01/2015 Pharyngeal Phase Impaired Pharyngeal-  Pudding Teaspoon -- Pharyngeal -- Pharyngeal- Pudding Cup -- Pharyngeal -- Pharyngeal- Honey Teaspoon -- Pharyngeal -- Pharyngeal- Honey Cup -- Pharyngeal -- Pharyngeal- Nectar Teaspoon -- Pharyngeal -- Pharyngeal- Nectar Cup Reduced tongue base retraction;Delayed swallow initiation-vallecula;Lateral channel residue;Pharyngeal residue - valleculae Pharyngeal -- Pharyngeal- Nectar Straw -- Pharyngeal -- Pharyngeal- Thin Teaspoon Delayed swallow initiation-vallecula;Pharyngeal residue - valleculae;Reduced tongue base retraction Pharyngeal --  Pharyngeal- Thin Cup Penetration/Aspiration before swallow;Moderate aspiration;Pharyngeal residue - valleculae;Reduced tongue base retraction Pharyngeal Material enters airway, passes BELOW cords and not ejected out despite cough attempt by patient Pharyngeal- Thin Straw Delayed swallow initiation-vallecula;Penetration/Aspiration during swallow;Pharyngeal residue - valleculae;Reduced tongue base retraction Pharyngeal Material enters airway, CONTACTS cords and then ejected out Pharyngeal- Puree Pharyngeal residue - valleculae;Reduced tongue base retraction;Delayed swallow initiation-vallecula Pharyngeal -- Pharyngeal- Mechanical Soft -- Pharyngeal -- Pharyngeal- Regular Delayed swallow initiation-vallecula;Pharyngeal residue - valleculae;Reduced tongue base retraction Pharyngeal -- Pharyngeal- Multi-consistency -- Pharyngeal -- Pharyngeal- Pill -- Pharyngeal -- Pharyngeal Comment --  No flowsheet data found. Marcene Duoshelsea Sumney MA, CCC-SLP Acute Care Speech Language Pathologist  Kennieth RadSumney, Chelsea E 10/01/2015, 2:05 PM               TELE NSR  ECHO - Left ventricle: The cavity size was normal. Wall thickness wasnormal. Systolic function was severely reduced. The estimated ejection fraction was in the range of 20% to 25%. Diffusehypokinesis. There is akinesis of the inferolateral myocardium. - Mitral valve: There was moderate regurgitation. - Pericardium, extracardiac: A trivial pericardial effusion was identified  ECG 10/02/15 - SR with PACs, LAFB, anterolateral infarction, CRO inferior infarction masked by LAFB, no acute repol changes  ASSESSMENT AND PLAN   1. Non-ST elevation myocardial infarction- Echocardiogram showed severely reduced LV function. However given his previous CVA, renal insufficiency and overall medical condition he is not a candidate for aggressive evaluation such as cardiac catheterization. - Continue aspirin. Add clopidogrel. - LFTs improving, still abnormal. Add statin later.  -  Not on ACE inhibitor given renal insufficiency.  - O nlow dose hydralazine, add nitrates - No beta blocker yet in the setting of acute congestive heart failure. Note significant AV node  2. Acute systolic congestive heart failure/signs of ischemic cardiomyopathy - I am not so sure that he is that hypervolemic. Note that he came in with malaise, reduced oral intake and diarrhea, severe hyperglycemia and was likely hypovolemic. Pulmonary edema was probably related to acute worsening of LV function due to new infarction and acidosis. Consider repeat limited 2D echo for EF and assessment of filling pressures (now in regular rhythm). Check another CXR and BNP. 3. Abnormal LFTs, likely due to sepsis - improving. Recheck in 2-3 days. 4. History of CVA with residual hemiparesis 5. Acute on chronic renal failure - improved since admission, appears to have reached steady state. Most recent assay prior to current illness showed creat 1.5 in 2012. 6. Diabetic ketoacidosis - resolved; not on insulin prior to admission 7. Hypernatremia - increase free water intake. Furosemide changed to PO. 8. Second degree AV block Mobitz 1, RBBB, LAFB - I am not sure that beta blockers will be a good idea since he appears to have advanced AV conduction disease.  9. Reported atrial fibrillation - on review of all the tracings, I do not find any evidence of this. Rhythm was often irregular due to Wenckebach cycles, especially during tachycardia.  Prognosis appears to be poor long-term. Palliative care consult is planned. Patient is a no CODE BLUE.   Thurmon FairMihai Nygel Prokop, MD, [facility]cy Hlth Sys CorpFACC [facility] [phone] office (  [phone] pager 10/03/2015 8:24 AM

## 2015-10-03 NOTE — Progress Notes (Signed)
VASCULAR LAB PRELIMINARY  PRELIMINARY  PRELIMINARY  PRELIMINARY  Bilateral lower extremity venous duplex completed.    Preliminary report:  There is no DVT or SVT noted in the bilateral lower extremities.   Shanaiya Bene, RVT 10/03/2015, 10:13 AM

## 2015-10-04 DIAGNOSIS — J9601 Acute respiratory failure with hypoxia: Secondary | ICD-10-CM | POA: Diagnosis present

## 2015-10-04 DIAGNOSIS — I441 Atrioventricular block, second degree: Secondary | ICD-10-CM | POA: Diagnosis present

## 2015-10-04 LAB — URINE CULTURE: Culture: NO GROWTH

## 2015-10-04 LAB — CBC
HEMATOCRIT: 32.2 % — AB (ref 39.0–52.0)
HEMOGLOBIN: 10.4 g/dL — AB (ref 13.0–17.0)
MCH: 32.3 pg (ref 26.0–34.0)
MCHC: 32.3 g/dL (ref 30.0–36.0)
MCV: 100 fL (ref 78.0–100.0)
Platelets: 159 10*3/uL (ref 150–400)
RBC: 3.22 MIL/uL — AB (ref 4.22–5.81)
RDW: 14.7 % (ref 11.5–15.5)
WBC: 11.5 10*3/uL — ABNORMAL HIGH (ref 4.0–10.5)

## 2015-10-04 LAB — GLUCOSE, CAPILLARY
GLUCOSE-CAPILLARY: 266 mg/dL — AB (ref 65–99)
GLUCOSE-CAPILLARY: 291 mg/dL — AB (ref 65–99)
GLUCOSE-CAPILLARY: 260 mg/dL — AB (ref 65–99)
Glucose-Capillary: 282 mg/dL — ABNORMAL HIGH (ref 65–99)

## 2015-10-04 LAB — COMPREHENSIVE METABOLIC PANEL
ALK PHOS: 62 U/L (ref 38–126)
ALT: 265 U/L — AB (ref 17–63)
AST: 56 U/L — ABNORMAL HIGH (ref 15–41)
Albumin: 2.7 g/dL — ABNORMAL LOW (ref 3.5–5.0)
Anion gap: 15 (ref 5–15)
BILIRUBIN TOTAL: 1.4 mg/dL — AB (ref 0.3–1.2)
BUN: 76 mg/dL — ABNORMAL HIGH (ref 6–20)
CO2: 22 mmol/L (ref 22–32)
CREATININE: 2.12 mg/dL — AB (ref 0.61–1.24)
Calcium: 8.5 mg/dL — ABNORMAL LOW (ref 8.9–10.3)
Chloride: 108 mmol/L (ref 101–111)
GFR, EST AFRICAN AMERICAN: 33 mL/min — AB (ref >60)
GFR, EST NON AFRICAN AMERICAN: 29 mL/min — AB (ref >60)
Glucose, Bld: 290 mg/dL — ABNORMAL HIGH (ref 65–99)
Potassium: 3.5 mmol/L (ref 3.5–5.1)
Sodium: 145 mmol/L (ref 135–145)
Total Protein: 6.1 g/dL — ABNORMAL LOW (ref 6.5–8.1)

## 2015-10-04 MED ORDER — INSULIN GLARGINE 100 UNIT/ML ~~LOC~~ SOLN
20.0000 [IU] | Freq: Every day | SUBCUTANEOUS | Status: DC
  Administered 2015-10-05 – 2015-10-06 (×2): 20 [IU] via SUBCUTANEOUS
  Filled 2015-10-04 (×2): qty 0.2

## 2015-10-04 MED ORDER — FUROSEMIDE 80 MG PO TABS
80.0000 mg | ORAL_TABLET | Freq: Two times a day (BID) | ORAL | Status: DC
  Administered 2015-10-04 – 2015-10-05 (×3): 80 mg via ORAL
  Filled 2015-10-04 (×3): qty 1

## 2015-10-04 MED ORDER — SODIUM CHLORIDE 0.9 % IV SOLN
1.5000 g | Freq: Four times a day (QID) | INTRAVENOUS | Status: DC
  Administered 2015-10-04 – 2015-10-07 (×13): 1.5 g via INTRAVENOUS
  Filled 2015-10-04 (×15): qty 1.5

## 2015-10-04 MED ORDER — ASPIRIN 325 MG PO TABS
325.0000 mg | ORAL_TABLET | Freq: Every day | ORAL | Status: DC
  Administered 2015-10-04 – 2015-10-05 (×2): 325 mg via ORAL
  Filled 2015-10-04 (×2): qty 1

## 2015-10-04 MED ORDER — CLOPIDOGREL BISULFATE 75 MG PO TABS
75.0000 mg | ORAL_TABLET | Freq: Every day | ORAL | Status: DC
  Administered 2015-10-04 – 2015-10-09 (×6): 75 mg via ORAL
  Filled 2015-10-04 (×6): qty 1

## 2015-10-04 NOTE — Progress Notes (Addendum)
PROGRESS NOTE  LATHAM KINZLER ZOX:[id number] DOB: 1940/11/14 DOA: 2015/10/02 PCP: [name], MD   Interim Summary 75 year old male with a history of diabetes mellitus type 2, stroke with residual right hemiparesis presented on 2015/10/02 with shortness of breath, bradycardia and syncope. The patient had a history of diarrhea abdominal pain and generalized malaise for several days prior to admission. CT abdomen and pelvis at the time of admission showed mild sigmoid thickening thought to be due to muscular hypertrophy without any other acute findings. CT of the chest that time showed bilateral pleural effusions. The patient was given atropine 1 mg with improvement of his heart rate. He was noted to be hypotensive and severely hyperglycemic in the emergency department. In addition, his troponins were elevated with EKG changes. The patient responded to fluid resuscitation. Cardiology was consulted for elevated troponins.  Assessment/Plan: Acute respiratory failure with hypoxia -Secondary to pulmonary edema/pleura effusions -stable on 2 L presently -10/04/15--concerned pt is aspirating given mental status, CXR, and clinical exam -10/03/15--CXR--pulm edema, RLL increased opacity -start Unasyn  Aspiration Pneumonia -see above -start Unasyn  Acute systolic CHF/ischemic cardiomyopathy -furosemide per cardiology -Cannot start ACE secondary to renal failure -monitor renal function -09/29/15--Echo--EF 20-25%, inferolateral AK -remains clinically fluid overloaded  DKA in DM2 -A1C--pending -Initially presented with serum glucose 586 with anion gap 18 -Initially on insulin drip--> transitioned to subcutaneous insulin -Lantus 10 units started 10/02/15 pm -increase Lantus to 20 units -Novolog 3 units with meals -NovoLog sliding scale -monitor CBGs and adjust  NSTEMI -Echocardiogram--EF 20-25 percent, diffuse HK, AK inferior lateral wall, moderate MR -Troponin peaked at  30.52 -Initially started on heparin drip -Cardiology is following -Not a candidate for heart catheterization -Cannot start ACE due to /AKICKD -Continue hydralazine and nitrates -Cannot add statin secondary to increased LFTs -Appreciate cardiology follow-up -continue ASA+plaix per cardiology  Second degree AV block Mobitz 1, RBBB -defer any possible need for AV nodal agents to cardiology  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) -secondary to volume depletion and hemodynamic changes -baseline creatinine 1.1-1.4 -Serum creatinine peaked at 3.89  Hypotension -improved -multifactorial including volume depletion, cardiomyopathy, and sepsis -Blood culture suggests contaminant -Urinalysis without significant pyuria -Zosyn 1/2-->1/8 -vanco 1/3-->1/7  -09/30/15 blood cultures neg -recheck CXR and UA with increase in WBC  Dysphagia -Evaluated by speech therapy 09/30/2015-->NPO initially -10/01/15 MBS-->dysphagia 1 with nectar thickened  Acute metabolic encephalopathy -EEG with diffuse slowing, no seizures -09/28/2015 CT brain negative -some improvement but unclear baseline--remains somnolent but answers simple questions  Transaminasemia -Secondary to shock liver -Abdominal ultrasound with fatty liver; no cholecystitis -improving  Hx of Stroke -continue ASA -PT/OT when stable  Goals of Care -DNR presently, but wife wants continued full care -consult palliative medicine--wife agreeable  Family Communication: Wife Updated on phone 10/04/15 Disposition Plan: remain in stepdown due to mutilple medical issues with signs of end organ failure   Procedures/Studies: Ct Abdomen Pelvis Wo Contrast  Oct 02, 2015  CLINICAL DATA:  Short of breath. sepsis. Elevated blood sugar. Diabetes. EXAM: CT CHEST, ABDOMEN AND PELVIS WITHOUT CONTRAST TECHNIQUE: Multidetector CT imaging of the chest, abdomen and pelvis was performed following the standard protocol without IV contrast. COMPARISON:  Plain  film chest earlier today.  No prior CTs. FINDINGS: CT CHEST Mediastinum/Nodes: Aortic and branch vessel atherosclerosis. Tortuous descending thoracic aorta. Mild cardiomegaly. Multivessel coronary artery atherosclerosis. Air within the right atrium and jugular veins is likely iatrogenic. No mediastinal or definite hilar adenopathy, given limitations of  unenhanced CT. Lungs/Pleura: Small bilateral pleural effusions. Multifactorial mild to moderate degradation. Motion and patient arm position (not raised above the head). Bibasilar dependent airspace disease. Musculoskeletal: No acute osseous abnormality. CT ABDOMEN AND PELVIS Hepatobiliary: Position and motion degradation continues into the upper abdomen. Grossly normal noncontrast appearance of the liver. The gallbladder is hyper attenuating, but suboptimally evaluated. No biliary duct dilatation. Pancreas: Mild to moderate pancreatic atrophy, without duct dilatation or surrounding inflammation. Spleen: Normal in size, without focal abnormality. Adrenals/Urinary Tract: Normal adrenal glands. Mild bilateral renal cortical thinning. No renal calculi or hydronephrosis. No hydroureter or ureteric calculi. No bladder calculi. Stomach/Bowel: Stomach is collapsed. Extensive colonic diverticulosis. Mild sigmoid wall thickening is likely due to muscular hypertrophy. Normal terminal ileum and appendix. Normal small bowel. Vascular/Lymphatic: Aortic and branch vessel atherosclerosis. No abdominopelvic adenopathy. Reproductive: Normal prostate. Other: No significant free fluid. Musculoskeletal: Lumbosacral spondylosis. IMPRESSION: 1. Degraded exam, secondary to motion, lack of IV contrast, and patient arm position, not raised above the head. 2. Small bilateral pleural effusions with adjacent bibasilar atelectasis. 3. Hepatic steatosis. 4. Hyper attenuation within the gallbladder, suboptimally evaluated. This could represent stones or sludge. If the patient has abdominal  symptoms, consider ultrasound. 5. Atherosclerosis, including within the coronary arteries. Electronically Signed   By: Jeronimo Greaves M.D.   On: 2015/10/08 18:21   Ct Head Wo Contrast  09/28/2015  CLINICAL DATA:  Increase confusion, difficulty swallowing EXAM: CT HEAD WITHOUT CONTRAST TECHNIQUE: Contiguous axial images were obtained from the base of the skull through the vertex without intravenous contrast. COMPARISON:  05/03/2007 FINDINGS: No skull fracture is noted. There is mucosal thickening right maxillary sinus. The mastoid air cells are unremarkable. No intracranial hemorrhage, mass effect or midline shift. Moderate cerebral atrophy with progression from prior exam. There is periventricular and patchy subcortical white matter decreased attenuation consistent with chronic small vessel ischemic changes. Small lacunar infarct is noted in left posterior corona radiata. No definite acute cortical infarction. No mass lesion is noted on this unenhanced scan. IMPRESSION: No acute intracranial abnormality. No definite acute cortical infarction. Moderate cerebral atrophy with progression from prior exam. Small lacunar infarct noted in left posterior corona radiata. Periventricular and patchy subcortical white matter decreased attenuation probable due to chronic small vessel ischemic changes. Electronically Signed   By: Natasha Mead M.D.   On: 09/28/2015 14:56   Ct Chest Wo Contrast  2015/10/08  CLINICAL DATA:  Short of breath. sepsis. Elevated blood sugar. Diabetes. EXAM: CT CHEST, ABDOMEN AND PELVIS WITHOUT CONTRAST TECHNIQUE: Multidetector CT imaging of the chest, abdomen and pelvis was performed following the standard protocol without IV contrast. COMPARISON:  Plain film chest earlier today.  No prior CTs. FINDINGS: CT CHEST Mediastinum/Nodes: Aortic and branch vessel atherosclerosis. Tortuous descending thoracic aorta. Mild cardiomegaly. Multivessel coronary artery atherosclerosis. Air within the right atrium and  jugular veins is likely iatrogenic. No mediastinal or definite hilar adenopathy, given limitations of unenhanced CT. Lungs/Pleura: Small bilateral pleural effusions. Multifactorial mild to moderate degradation. Motion and patient arm position (not raised above the head). Bibasilar dependent airspace disease. Musculoskeletal: No acute osseous abnormality. CT ABDOMEN AND PELVIS Hepatobiliary: Position and motion degradation continues into the upper abdomen. Grossly normal noncontrast appearance of the liver. The gallbladder is hyper attenuating, but suboptimally evaluated. No biliary duct dilatation. Pancreas: Mild to moderate pancreatic atrophy, without duct dilatation or surrounding inflammation. Spleen: Normal in size, without focal abnormality. Adrenals/Urinary Tract: Normal adrenal glands. Mild bilateral renal cortical thinning. No renal calculi or hydronephrosis. No hydroureter  or ureteric calculi. No bladder calculi. Stomach/Bowel: Stomach is collapsed. Extensive colonic diverticulosis. Mild sigmoid wall thickening is likely due to muscular hypertrophy. Normal terminal ileum and appendix. Normal small bowel. Vascular/Lymphatic: Aortic and branch vessel atherosclerosis. No abdominopelvic adenopathy. Reproductive: Normal prostate. Other: No significant free fluid. Musculoskeletal: Lumbosacral spondylosis. IMPRESSION: 1. Degraded exam, secondary to motion, lack of IV contrast, and patient arm position, not raised above the head. 2. Small bilateral pleural effusions with adjacent bibasilar atelectasis. 3. Hepatic steatosis. 4. Hyper attenuation within the gallbladder, suboptimally evaluated. This could represent stones or sludge. If the patient has abdominal symptoms, consider ultrasound. 5. Atherosclerosis, including within the coronary arteries. Electronically Signed   By: Jeronimo GreavesKyle  Talbot M.D.   On: 10/24/2015 18:21   Dg Chest Port 1 View  10/03/2015  CLINICAL DATA:  Acute respiratory failure with hypoxia EXAM:  PORTABLE CHEST 1 VIEW COMPARISON:  Two days ago FINDINGS: Unchanged diffuse interstitial opacity with hazy appearance of the perihilar and basilar lungs. Unchanged cardiomegaly and vascular pedicle widening. Small pleural effusions are likely. No pneumothorax. IMPRESSION: Stable CHF pattern. Electronically Signed   By: Marnee SpringJonathon  Watts M.D.   On: 10/03/2015 09:13   Dg Chest Port 1 View  10/01/2015  CLINICAL DATA:  Acute respiratory failure EXAM: PORTABLE CHEST 1 VIEW COMPARISON:  Chest radiograph from one day prior. FINDINGS: Stable cardiomediastinal silhouette with mild cardiomegaly. No pneumothorax. No right pleural effusion. Possible small left pleural effusion (the left costophrenic angle is excluded from this image). There are worsening severe diffuse parahilar hazy and linear opacities. IMPRESSION: Stable mild cardiomegaly with worsening severe diffuse parahilar hazy and linear opacities, favor severe pulmonary edema from worsening congestive heart failure. Electronically Signed   By: Delbert PhenixJason A Poff M.D.   On: 10/01/2015 07:15   Dg Chest Port 1 View  09/30/2015  CLINICAL DATA:  Acute respiratory failure, DKA, nonsmoker. EXAM: PORTABLE CHEST 1 VIEW COMPARISON:  Portable chest x-ray of September 29, 2015 FINDINGS: The lungs are adequately inflated. The hemidiaphragms are better in flat demonstrated today. The pulmonary interstitial markings remain increased. The cardiac silhouette remains enlarged. The pulmonary vascularity remains engorged. There may be a small amount of pleural fluid at the right lung base. IMPRESSION: Congestive heart failure with pulmonary interstitial edema and small right pleural effusion. Overall there has been interval improvement since yesterday's study. Electronically Signed   By: Shjon Lizarraga  [place] M.D.   On: 09/30/2015 07:37   Dg Chest Port 1 View  09/29/2015  CLINICAL DATA:  Diabetes. EXAM: PORTABLE CHEST 1 VIEW COMPARISON:  CT 09/28/2015. FINDINGS: Cardiomegaly with diffuse bilateral  pulmonary alveolar infiltrates consistent with congestive heart failure pulmonary edema. Small pleural effusions. No pneumothorax . No acute osseous abnormality . IMPRESSION: Congestive heart failure with pulmonary edema. Electronically Signed   By: Maisie Fushomas  Register   On: 09/29/2015 08:45   Dg Chest Port 1 View  10/25/2015  CLINICAL DATA:  Dyspnea, abdominal pain, body aches EXAM: PORTABLE CHEST 1 VIEW COMPARISON:  04/22/2015 FINDINGS: Cardiomediastinal silhouette is stable. No acute infiltrate or pleural effusion. No pulmonary edema. Bony thorax is unremarkable. IMPRESSION: No active disease. Electronically Signed   By: Natasha MeadLiviu  Pop M.D.   On: 10/19/2015 15:29   Dg Swallowing Func-speech Pathology  10/01/2015  Objective Swallowing Evaluation:   Patient Details Name: Lockie Pareslfred K Seckman MRN: [id number] Date of Birth: 11-17-1940 Today's Date: 10/01/2015 Time: SLP Start Time (ACUTE ONLY): 0750-SLP Stop Time (ACUTE ONLY): 14780812 SLP Time Calculation (min) (ACUTE ONLY): 22 min Past Medical History: @PMH @ Past  Surgical History: No past surgical history on file. HPI: 75 yo white male with a h/o type 2 DM and CVA (s/p 7 yrs)with residual right-sided weakness who presents via EMS with SOB, syncope and bradycardia and flu like symptoms for several days. Pt found to have NSTEMI, acute renal failure , hyperkalemia, sepsis. Pt did not require intubation. CXR congestive heart failure with pulmonary interstitial edema and small right pleural effusion. Overall there has been interval improvement since yesterday's study. Pt' s wife states pt had dysphagia at time of stroke but having "difficulty swallowing over that past month." No Data Recorded Assessment / Plan / Recommendation CHL IP CLINICAL IMPRESSIONS 10/01/2015 Therapy Diagnosis Mild to moderate oropharyngeal dysphagia Clinical Impression Pt presents with mild to moderate sensory motor  oropharyngeal dysphagia. Note weak bolus manipulation and decreased lingual coordination with  delay in swallow initiation across all consistencies. Aspiration evidenced inconsistently with thin liquids via cup sip that was sensed but not effectively cleared by pts weak reflexive cough. Mild vallecular residuals noted with all PO which reduced with additonal swallow and did not impact airway protection. No aspiration or penetration observed with nectar thick liquids.  Pts decreased respiratory support and cognitive decline further  increase aspiration risk. Pt with notable work of breathing during MBS. Recommend dysphagia 1 ( puree) and nectar thick liquids with medicines whole in puree. Full supervision with all PO. ST to follow up and upgrade PO as clinically tolerated.  Impact on safety and function Moderate aspiration risk   CHL IP TREATMENT RECOMMENDATION 10/01/2015 Treatment Recommendations Therapy as outlined in treatment plan below   Prognosis 10/01/2015 Prognosis for Safe Diet Advancement Fair Barriers to Reach Goals Cognitive deficits;Severity of deficits Barriers/Prognosis Comment -- CHL IP DIET RECOMMENDATION 10/01/2015 SLP Diet Recommendations Dysphagia 2 (Fine chop) solids;Nectar thick liquid Liquid Administration via Cup Medication Administration Whole meds with puree Compensations Slow rate;Minimize environmental distractions;Small sips/bites Postural Changes Remain semi-upright after after feeds/meals (Comment);Seated upright at 90 degrees   CHL IP OTHER RECOMMENDATIONS 10/01/2015 Recommended Consults -- Oral Care Recommendations Oral care BID Other Recommendations Order thickener from pharmacy   CHL IP FOLLOW UP RECOMMENDATIONS 10/01/2015 Follow up Recommendations 24 hour supervision/assistance   CHL IP FREQUENCY AND DURATION 10/01/2015 Speech Therapy Frequency (ACUTE ONLY) min 2x/week Treatment Duration 2 weeks      CHL IP ORAL PHASE 10/01/2015 Oral Phase Impaired Oral - Pudding Teaspoon -- Oral - Pudding Cup -- Oral - Honey Teaspoon -- Oral - Honey Cup -- Oral - Nectar Teaspoon -- Oral - Nectar Cup Weak  lingual manipulation;Delayed oral transit Oral - Nectar Straw -- Oral - Thin Teaspoon Weak lingual manipulation;Delayed oral transit Oral - Thin Cup Weak lingual manipulation;Delayed oral transit Oral - Thin Straw Weak lingual manipulation;Delayed oral transit Oral - Puree Weak lingual manipulation;Delayed oral transit Oral - Mech Soft -- Oral - Regular Weak lingual manipulation;Delayed oral transit Oral - Multi-Consistency -- Oral - Pill -- Oral Phase - Comment --  CHL IP PHARYNGEAL PHASE 10/01/2015 Pharyngeal Phase Impaired Pharyngeal- Pudding Teaspoon -- Pharyngeal -- Pharyngeal- Pudding Cup -- Pharyngeal -- Pharyngeal- Honey Teaspoon -- Pharyngeal -- Pharyngeal- Honey Cup -- Pharyngeal -- Pharyngeal- Nectar Teaspoon -- Pharyngeal -- Pharyngeal- Nectar Cup Reduced tongue base retraction;Delayed swallow initiation-vallecula;Lateral channel residue;Pharyngeal residue - valleculae Pharyngeal -- Pharyngeal- Nectar Straw -- Pharyngeal -- Pharyngeal- Thin Teaspoon Delayed swallow initiation-vallecula;Pharyngeal residue - valleculae;Reduced tongue base retraction Pharyngeal -- Pharyngeal- Thin Cup Penetration/Aspiration before swallow;Moderate aspiration;Pharyngeal residue - valleculae;Reduced tongue base retraction Pharyngeal Material enters airway, passes BELOW  cords and not ejected out despite cough attempt by patient Pharyngeal- Thin Straw Delayed swallow initiation-vallecula;Penetration/Aspiration during swallow;Pharyngeal residue - valleculae;Reduced tongue base retraction Pharyngeal Material enters airway, CONTACTS cords and then ejected out Pharyngeal- Puree Pharyngeal residue - valleculae;Reduced tongue base retraction;Delayed swallow initiation-vallecula Pharyngeal -- Pharyngeal- Mechanical Soft -- Pharyngeal -- Pharyngeal- Regular Delayed swallow initiation-vallecula;Pharyngeal residue - valleculae;Reduced tongue base retraction Pharyngeal -- Pharyngeal- Multi-consistency -- Pharyngeal -- Pharyngeal- Pill --  Pharyngeal -- Pharyngeal Comment --  No flowsheet data found. Marcene Duos MA, CCC-SLP Acute Care Speech Language Pathologist  Kennieth Rad 10/01/2015, 2:05 PM              US Abdomen Limited Ruq  09/29/2015  CLINICAL DATA:  Elevated liver function studies EXAM: US ABDOMEN LIMITED - RIGHT UPPER QUADRANT COMPARISON:  CT scan 2015/10/07 FINDINGS: Gallbladder: No gallstones or wall thickening visualized. No sonographic Murphy sign noted by sonographer. Common bile duct: Diameter: 5.7 mm Liver: There is diffuse increased echogenicity of the liver and decreased through transmission consistent with fatty infiltration. No focal lesions or biliary dilatation. IMPRESSION: 1. Diffuse fatty infiltration of the liver but no focal hepatic lesions or intrahepatic biliary dilatation. 2. Normal gallbladder and normal caliber common bile duct. 3. Right pleural effusion. Electronically Signed   By: Rudie Meyer M.D.   On: 09/29/2015 17:05         Subjective: Patient is somnolent but arouses and answers simple questions. Denies any fevers, chills, chest pain, shortness breath, abdominal pain. No vomiting or diarrhea.  Objective: Filed Vitals:   10/04/15 0400 10/04/15 0405 10/04/15 0500 10/04/15 0741  BP: 129/80 129/80 133/71 144/85  Pulse: 86 85 85 84  Temp:  97.6 F (36.4 C)  98 F (36.7 C)  TempSrc:  Oral  Oral  Resp: 24 24 22 26   Height:      Weight:  120.9 kg (266 lb 8.6 oz)    SpO2: 96% 96% 98% 95%    Intake/Output Summary (Last 24 hours) at 10/04/15 0811 Last data filed at 10/04/15 0500  Gross per 24 hour  Intake    750 ml  Output   1320 ml  Net   -570 ml   Weight change:  Exam:   General:  Pt is alert, follows commands appropriately, not in acute distress  HEENT: No icterus, No thrush, No neck mass, [place]/AT  Cardiovascular: RRR, S1/S2, no rubs, no gallops  Respiratory: CTA bilaterally, no wheezing, no crackles, no rhonchi  Abdomen: Soft/+BS, non tender, non distended, no  guarding  Extremities: No edema, No lymphangitis, No petechiae, No rashes, no synovitis  Data Reviewed: Basic Metabolic Panel:  Recent Labs Lab 10-07-2015 1642  09/28/15 0500  09/29/15 0300 09/30/15 0305 10/01/15 0601 10/02/15 0235 10/03/15 0310 10/04/15 0555  NA 138  < > 145  < > 144 144 144 141 146* 145  K 6.4*  < > 3.9  < > 4.2 3.6 3.5 3.6 3.5 3.5  CL 106  < > 112*  < > 111 111 110 108 110 108  CO2 16*  < > 20*  < > 19* 20* 21* 19* 22 22  GLUCOSE 514*  < > 133*  < > 69 112* 187* 348* 248* 290*  BUN 48*  < > 50*  < > 63* 72* 65* 66* 73* 76*  CREATININE 3.09*  < > 3.15*  < > 2.80* 2.58* 2.26* 2.22* 2.47* 2.12*  CALCIUM 9.4  < > 9.2  < > 9.0 8.8* 8.5* 8.7* 8.8*  8.5*  MG 1.9  --  1.8  --  1.8 1.9 1.9  --   --   --   PHOS 4.6  --  2.9  --  3.7 3.9 4.0  --   --   --   < > = values in this interval not displayed. Liver Function Tests:  Recent Labs Lab Oct 12, 2015 1329 09/29/15 0300 09/30/15 0305 10/03/15 0310 10/04/15 0555  AST 111* 596* 286* 115* 56*  ALT 89* 693* 578* 370* 265*  ALKPHOS 52 52 49 61 62  BILITOT 3.0* 1.6* 1.1 1.4* 1.4*  PROT 6.0* 6.2* 5.8* 6.2* 6.1*  ALBUMIN 3.2* 2.9* 3.0* 2.8* 2.7*   No results for input(s): LIPASE, AMYLASE in the last 168 hours.  Recent Labs Lab 09/30/15 1020  AMMONIA 33   CBC:  Recent Labs Lab 10-12-2015 1642  09/30/15 0305 10/01/15 0601 10/02/15 0235 10/03/15 0310 10/04/15 0555  WBC 5.8  < > 9.9 8.3 10.2 12.3* 11.5*  NEUTROABS 4.8  --   --   --   --   --   --   HGB 11.3*  < > 10.5* 9.7* 10.7* 10.2* 10.4*  HCT 33.7*  < > 32.3* 29.9* 33.5* 31.0* 32.2*  MCV 96.3  < > 97.0 96.8 98.2 96.9 100.0  PLT 124*  < > 133* 138* 138* 150 159  < > = values in this interval not displayed. Cardiac Enzymes:  Recent Labs Lab 09/29/15 1541 09/29/15 2123 09/30/15 1020 09/30/15 1625 09/30/15 2059  TROPONINI 28.81* 28.78* 26.19* 19.73* 18.77*   BNP: Invalid input(s): POCBNP CBG:  Recent Labs Lab 10/02/15 2144 10/03/15 0720  10/03/15 1132 10/03/15 1705 10/03/15 2120  GLUCAP 237* 237* 308* 245* 261*    Recent Results (from the past 240 hour(s))  Blood Culture (routine x 2)     Status: None   Collection Time: 10-12-15  4:42 PM  Result Value Ref Range Status   Specimen Description BLOOD LEFT HAND  Final   Special Requests IN PEDIATRIC BOTTLE 1CC  Final   Culture NO GROWTH 5 DAYS  Final   Report Status 10/02/2015 FINAL  Final  Blood Culture (routine x 2)     Status: None   Collection Time: Oct 12, 2015  4:58 PM  Result Value Ref Range Status   Specimen Description BLOOD LEFT FOREARM  Final   Special Requests BOTTLES DRAWN AEROBIC AND ANAEROBIC 5CC  Final   Culture  Setup Time   Final    GRAM POSITIVE COCCI IN CLUSTERS ANAEROBIC BOTTLE ONLY CRITICAL RESULT CALLED TO, READ BACK BY AND VERIFIED WITH: Candy Sledge RN 1530 09/28/15 A BROWNING    Culture   Final    STAPHYLOCOCCUS SPECIES (COAGULASE NEGATIVE) THE SIGNIFICANCE OF ISOLATING THIS ORGANISM FROM A SINGLE SET OF BLOOD CULTURES WHEN MULTIPLE SETS ARE DRAWN IS UNCERTAIN. PLEASE NOTIFY THE MICROBIOLOGY DEPARTMENT WITHIN ONE WEEK IF SPECIATION AND SENSITIVITIES ARE REQUIRED.    Report Status 09/30/2015 FINAL  Final  MRSA PCR Screening     Status: None   Collection Time: 10/12/2015  6:28 PM  Result Value Ref Range Status   MRSA by PCR NEGATIVE NEGATIVE Final    Comment:        The GeneXpert MRSA Assay (FDA approved for NASAL specimens only), is one component of a comprehensive MRSA colonization surveillance program. It is not intended to diagnose MRSA infection nor to guide or monitor treatment for MRSA infections.   Urine culture     Status: None   Collection  Time: 09/28/15  7:16 PM  Result Value Ref Range Status   Specimen Description URINE, CLEAN CATCH  Final   Special Requests NONE  Final   Culture NO GROWTH 2 DAYS  Final   Report Status 09/30/2015 FINAL  Final  Culture, blood (Routine X 2) w Reflex to ID Panel     Status: None (Preliminary result)    Collection Time: 09/30/15 10:20 AM  Result Value Ref Range Status   Specimen Description BLOOD LEFT HAND  Final   Special Requests IN PEDIATRIC BOTTLE 2CC  Final   Culture NO GROWTH 3 DAYS  Final   Report Status PENDING  Incomplete  Culture, blood (Routine X 2) w Reflex to ID Panel     Status: None (Preliminary result)   Collection Time: 09/30/15 10:27 AM  Result Value Ref Range Status   Specimen Description BLOOD LEFT HAND  Final   Special Requests IN PEDIATRIC BOTTLE 3CC  Final   Culture NO GROWTH 3 DAYS  Final   Report Status PENDING  Incomplete     Scheduled Meds: . aspirin  300 mg Rectal Daily  . dextrose  1 ampule Intravenous Once  . furosemide  40 mg Oral Daily  . heparin subcutaneous  5,000 Units Subcutaneous 3 times per day  . hydrALAZINE  10 mg Oral 3 times per day  . insulin aspart  0-15 Units Subcutaneous TID WC  . insulin glargine  10 Units Subcutaneous Daily  . isosorbide dinitrate  10 mg Oral TID   Continuous Infusions: . sodium chloride 10 mL/hr at 10/03/15 1900  . diltiazem (CARDIZEM) infusion Stopped (10/03/15 1045)     Hava Massingale, DO  [facility] Pager [phone]  If 7PM-7AM, please contact night-coverage [email or web address] Password [email or web address] 10/04/2015, 8:11 AM   LOS: 7 days

## 2015-10-04 NOTE — Progress Notes (Signed)
Speech Language Pathology Treatment: Dysphagia  Patient Details Name: [name] MRN: [id number] DOB: 27-Jul-1941 Today's Date: 10/04/2015 Time: [id number] SLP Time Calculation (min) (ACUTE ONLY): 21 min  Assessment / Plan / Recommendation Clinical Impression  Pt with increased fatigue, poor endurance and mouth breathing compared to initial assessment 1/6. Audible significant congested respirations at rest with increased work of breathing following spoon/1 cup sip nectar thick liquid. Pt is unable to produce volitional throat clear/cough and swallow to assist in clearance of laryngeal vestibule/airway and pharyngeal residue despite therapeutic cues with dry spoon. Delayed cough at end of session; aspiration is probable. Family arrived and briefly educated re: swallow status. Agree with Palliative care consult; not sure family sees whole picture or not (?). Continue Dys 1, nectar via spoon as pt is able.     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 Pt found to have NSTEMI, acute renal failure , hyperkalemia, sepsis. Pt did not require intubation. CXR congestive heart failure with pulmonary interstitial edema and small right pleural effusion. Overall there has been interval improvement since yesterday's study. Pt' s wife states pt had dysphagia at time of stroke but having "difficulty swallowing over that past month."      SLP Plan  Continue with current plan of care     Recommendations  Diet recommendations: Dysphagia 1 (puree);Nectar-thick liquid Liquids provided via: Cup;No straw;Teaspoon Medication Administration: Crushed with puree Supervision: Staff to assist with self feeding;Full supervision/cueing for compensatory strategies Compensations: Slow rate;Small sips/bites;Lingual sweep for clearance of pocketing Postural Changes  and/or Swallow Maneuvers: Seated upright 90 degrees              Oral Care Recommendations: Oral care BID Follow up Recommendations: 24 hour supervision/assistance Plan: Continue with current plan of care   Royce MacadamiaLitaker, Paulene Tayag Willis 10/04/2015, 11:43 AM  Breck CoonsLisa Willis Lonell FaceLitaker M.Ed [employer]P Pager [phone]

## 2015-10-04 NOTE — Progress Notes (Signed)
Patient Name: [name] Date of Encounter: 10/04/2015  Active Problems:   DKA (diabetic ketoacidoses) (HCC)   Acute respiratory failure (HCC)   NSTEMI (non-ST elevated myocardial infarction) (HCC)   Acute systolic CHF (congestive heart failure) (HCC)   Paroxysmal atrial fibrillation (HCC)   Dysphagia   Acute renal failure superimposed on stage 3 chronic kidney disease (HCC)   Elevated transaminase level   Acute respiratory failure with hypoxia (HCC)   Length of Stay: 7  SUBJECTIVE  Still only minimally responsive. No apparent distress. Slight tachypnea. CXR and BNP do appear to support persistent hypervolemia. Creatinine a little lower.  CURRENT MEDS . ampicillin-sulbactam (UNASYN) IV  1.5 g Intravenous Q6H  . aspirin  325 mg Oral Daily  . dextrose  1 ampule Intravenous Once  . furosemide  40 mg Oral Daily  . heparin subcutaneous  5,000 Units Subcutaneous 3 times per day  . hydrALAZINE  10 mg Oral 3 times per day  . insulin aspart  0-15 Units Subcutaneous TID WC  . [START ON 10/05/2015] insulin glargine  20 Units Subcutaneous Daily  . isosorbide dinitrate  10 mg Oral TID    OBJECTIVE   Intake/Output Summary (Last 24 hours) at 10/04/15 1555 Last data filed at 10/04/15 1400  Gross per 24 hour  Intake    550 ml  Output   1200 ml  Net   -650 ml   Filed Weights   2015-09-30 1530 09/30/15 1800 10/04/15 0405  Weight: 250 lb (113.399 kg) 203 lb 4.2 oz (92.2 kg) 266 lb 8.6 oz (120.9 kg)    PHYSICAL EXAM Filed Vitals:   10/04/15 1000 10/04/15 1133 10/04/15 1200 10/04/15 1400  BP: 127/78 143/75 140/84 129/96  Pulse: 82 84 94 85  Temp:  98.2 F (36.8 C)    TempSrc:  Oral    Resp: 23 23 23 25   Height:      Weight:      SpO2: 95% 94% 95% 94%   General: Obtunded, no distress Head: no evidence of trauma, PERRL, EOMI, no exophtalmos or lid lag, no myxedema, no xanthelasma; normal ears, nose and oropharynx, dry mucous membranes Neck: normal jugular venous  pulsations and no hepatojugular reflux; brisk carotid pulses without delay and no carotid bruits Chest: clear to auscultation, no signs of consolidation by percussion or palpation, normal fremitus, symmetrical and full respiratory excursions Cardiovascular: normal position and quality of the apical impulse, regular rhythm, normal first and second heart sounds, no rubs or gallops, no murmur Abdomen: no tenderness or distention, no masses by palpation, no abnormal pulsatility or arterial bruits, normal bowel sounds, no hepatosplenomegaly Extremities: no clubbing, cyanosis; 1-2 + generalized edema; 2+ radial, ulnar and brachial pulses bilaterally;cannot locate pedal pulses Neurological: right hemiparesis, but very limited cooperation with exam  LABS  CBC  Recent Labs  10/03/15 0310 10/04/15 0555  WBC 12.3* 11.5*  HGB 10.2* 10.4*  HCT 31.0* 32.2*  MCV 96.9 100.0  PLT 150 159   Basic Metabolic Panel  Recent Labs  10/03/15 0310 10/04/15 0555  NA 146* 145  K 3.5 3.5  CL 110 108  CO2 22 22  GLUCOSE 248* 290*  BUN 73* 76*  CREATININE 2.47* 2.12*  CALCIUM 8.8* 8.5*   Liver Function Tests  Recent Labs  10/03/15 0310 10/04/15 0555  AST 115* 56*  ALT 370* 265*  ALKPHOS 61 62  BILITOT 1.4* 1.4*  PROT 6.2* 6.1*  ALBUMIN 2.8* 2.7*    Radiology Studies Imaging results have been  reviewed and Dg Chest Port 1 View  10/03/2015  CLINICAL DATA:  Acute respiratory failure with hypoxia EXAM: PORTABLE CHEST 1 VIEW COMPARISON:  Two days ago FINDINGS: Unchanged diffuse interstitial opacity with hazy appearance of the perihilar and basilar lungs. Unchanged cardiomegaly and vascular pedicle widening. Small pleural effusions are likely. No pneumothorax. IMPRESSION: Stable CHF pattern. Electronically Signed   By: Marnee SpringJonathon  Watts M.D.   On: 10/03/2015 09:13    TELE NSR with PVCs   ASSESSMENT AND PLAN   1. Non-ST elevation myocardial infarction- Echocardiogram showed severely reduced LV  function. However given his previous CVA, renal insufficiency and overall medical condition he is not a candidate for aggressive evaluation such as cardiac catheterization. - Continue aspirin. Add clopidogrel. - LFTs continue to improve. Add statin later.  - Not on ACE inhibitor given renal insufficiency.  - On low dose hydralazine, add nitrates - No beta blocker yet in the setting of acute congestive heart failure. Note significant AV node conduction abnormalities 2. Acute systolic congestive heart failure/signs of ischemic cardiomyopathy -he does appear to be hypervolemic. Will increase diuretic dose 3. Abnormal LFTs, likely due to sepsis - improving. 4. History of CVA with residual hemiparesis 5. Acute on chronic renal failure - improved since admission, appears to have reached steady state. Most recent assay prior to current illness showed creat 1.5 in 2012. 6. Diabetic ketoacidosis - resolved; not on insulin prior to admission 7. Hypernatremia - a little better today, still borderline; increase free water intake.  8. Second degree AV block Mobitz 1, RBBB, LAFB - I am not sure that beta blockers will be a good idea since he appears to have advanced AV conduction disease.  9. Reported atrial fibrillation - on review of all the tracings, I do not find any evidence of this. Rhythm was often irregular due to Wenckebach cycles, especially during tachycardia.  Prognosis appears to be poor long-term. Palliative care consult is planned. Patient is a no CODE BLUE.   Thurmon FairMihai Fishel Wamble, MD, [facility] [phone] office [phone] pager 10/04/2015 3:55 PM

## 2015-10-04 NOTE — Progress Notes (Signed)
Inpatient Diabetes Program Recommendations  AACE/ADA: New Consensus Statement on Inpatient Glycemic Control (2015)  Target Ranges:  Prepandial:   less than 140 mg/dL      Peak postprandial:   less than 180 mg/dL (1-2 hours)      Critically ill patients:  140 - 180 mg/dL   Review of Glycemic Control  Inpatient Diabetes Program Recommendations:  Insulin - Basal: Increase Lantus to 10 BID Thank you  Piedad ClimesGina Asha Grumbine BSN, RN,CDE Inpatient Diabetes Coordinator [phone] (team pager)

## 2015-10-05 DIAGNOSIS — J690 Pneumonitis due to inhalation of food and vomit: Secondary | ICD-10-CM | POA: Clinically undetermined

## 2015-10-05 DIAGNOSIS — I255 Ischemic cardiomyopathy: Secondary | ICD-10-CM | POA: Diagnosis present

## 2015-10-05 DIAGNOSIS — I959 Hypotension, unspecified: Secondary | ICD-10-CM | POA: Diagnosis present

## 2015-10-05 DIAGNOSIS — Z515 Encounter for palliative care: Secondary | ICD-10-CM

## 2015-10-05 DIAGNOSIS — G9341 Metabolic encephalopathy: Secondary | ICD-10-CM | POA: Diagnosis present

## 2015-10-05 DIAGNOSIS — R1310 Dysphagia, unspecified: Secondary | ICD-10-CM

## 2015-10-05 LAB — GLUCOSE, CAPILLARY
GLUCOSE-CAPILLARY: 240 mg/dL — AB (ref 65–99)
GLUCOSE-CAPILLARY: 198 mg/dL — AB (ref 65–99)
GLUCOSE-CAPILLARY: 208 mg/dL — AB (ref 65–99)
Glucose-Capillary: 279 mg/dL — ABNORMAL HIGH (ref 65–99)

## 2015-10-05 LAB — MAGNESIUM: MAGNESIUM: 2.3 mg/dL (ref 1.7–2.4)

## 2015-10-05 LAB — CBC
HCT: 34.2 % — ABNORMAL LOW (ref 39.0–52.0)
Hemoglobin: 11 g/dL — ABNORMAL LOW (ref 13.0–17.0)
MCH: 32.2 pg (ref 26.0–34.0)
MCHC: 32.2 g/dL (ref 30.0–36.0)
MCV: 100 fL (ref 78.0–100.0)
Platelets: 165 10*3/uL (ref 150–400)
RBC: 3.42 MIL/uL — ABNORMAL LOW (ref 4.22–5.81)
RDW: 14.7 % (ref 11.5–15.5)
WBC: 10.2 10*3/uL (ref 4.0–10.5)

## 2015-10-05 LAB — CULTURE, BLOOD (ROUTINE X 2)
Culture: NO GROWTH
Culture: NO GROWTH

## 2015-10-05 LAB — COMPREHENSIVE METABOLIC PANEL
ALT: 223 U/L — ABNORMAL HIGH (ref 17–63)
AST: 55 U/L — ABNORMAL HIGH (ref 15–41)
Albumin: 2.6 g/dL — ABNORMAL LOW (ref 3.5–5.0)
Alkaline Phosphatase: 66 U/L (ref 38–126)
Anion gap: 12 (ref 5–15)
BUN: 64 mg/dL — ABNORMAL HIGH (ref 6–20)
CO2: 25 mmol/L (ref 22–32)
Calcium: 8.6 mg/dL — ABNORMAL LOW (ref 8.9–10.3)
Chloride: 111 mmol/L (ref 101–111)
Creatinine, Ser: 1.86 mg/dL — ABNORMAL HIGH (ref 0.61–1.24)
GFR calc Af Amer: 39 mL/min — ABNORMAL LOW (ref >60)
GFR calc non Af Amer: 34 mL/min — ABNORMAL LOW (ref >60)
Glucose, Bld: 293 mg/dL — ABNORMAL HIGH (ref 65–99)
Potassium: 3.1 mmol/L — ABNORMAL LOW (ref 3.5–5.1)
Sodium: 148 mmol/L — ABNORMAL HIGH (ref 135–145)
Total Bilirubin: 1.5 mg/dL — ABNORMAL HIGH (ref 0.3–1.2)
Total Protein: 6.3 g/dL — ABNORMAL LOW (ref 6.5–8.1)

## 2015-10-05 LAB — HEMOGLOBIN A1C
Hgb A1c MFr Bld: 7.4 % — ABNORMAL HIGH (ref 4.8–5.6)
MEAN PLASMA GLUCOSE: 166 mg/dL

## 2015-10-05 MED ORDER — ATORVASTATIN CALCIUM 10 MG PO TABS
10.0000 mg | ORAL_TABLET | Freq: Every day | ORAL | Status: DC
  Administered 2015-10-05 – 2015-10-09 (×4): 10 mg via ORAL
  Filled 2015-10-05 (×5): qty 1

## 2015-10-05 MED ORDER — POTASSIUM CHLORIDE CRYS ER 20 MEQ PO TBCR
40.0000 meq | EXTENDED_RELEASE_TABLET | Freq: Once | ORAL | Status: AC
  Administered 2015-10-05: 40 meq via ORAL
  Filled 2015-10-05: qty 2

## 2015-10-05 MED ORDER — GUAIFENESIN ER 600 MG PO TB12
1200.0000 mg | ORAL_TABLET | Freq: Two times a day (BID) | ORAL | Status: DC
  Administered 2015-10-05 – 2015-10-09 (×9): 1200 mg via ORAL
  Filled 2015-10-05 (×10): qty 2

## 2015-10-05 MED ORDER — CETYLPYRIDINIUM CHLORIDE 0.05 % MT LIQD
7.0000 mL | Freq: Two times a day (BID) | OROMUCOSAL | Status: DC
  Administered 2015-10-05 – 2015-10-09 (×9): 7 mL via OROMUCOSAL

## 2015-10-05 MED ORDER — DEXTROSE 5 % IV SOLN
INTRAVENOUS | Status: DC
  Administered 2015-10-05 – 2015-10-08 (×6): via INTRAVENOUS
  Filled 2015-10-05: qty 1000

## 2015-10-05 MED ORDER — ASPIRIN EC 81 MG PO TBEC
81.0000 mg | DELAYED_RELEASE_TABLET | Freq: Every day | ORAL | Status: DC
  Administered 2015-10-06 – 2015-10-09 (×4): 81 mg via ORAL
  Filled 2015-10-05 (×4): qty 1

## 2015-10-05 MED ORDER — POTASSIUM CHLORIDE 20 MEQ PO PACK: 40.0000 meq | PACK | Freq: Once | ORAL | Status: DC

## 2015-10-05 MED ORDER — POTASSIUM CHLORIDE CRYS ER 20 MEQ PO TBCR
30.0000 meq | EXTENDED_RELEASE_TABLET | Freq: Two times a day (BID) | ORAL | Status: DC
  Administered 2015-10-05 – 2015-10-09 (×8): 30 meq via ORAL
  Filled 2015-10-05 (×8): qty 1

## 2015-10-05 NOTE — Progress Notes (Signed)
Patient Name: [name] Date of Encounter: 10/05/2015  Principal Problem:   Acute respiratory failure with hypoxia (HCC) Active Problems:   DKA (diabetic ketoacidoses) (HCC)   Acute respiratory failure (HCC)   NSTEMI (non-ST elevated myocardial infarction) (HCC)   Acute systolic CHF (congestive heart failure) (HCC)   Paroxysmal atrial fibrillation (HCC)   Dysphagia   Acute renal failure superimposed on stage 3 chronic kidney disease (HCC)   Elevated transaminase level   Wenckebach second degree AV block   Aspiration pneumonia (HCC)   Cardiomyopathy, ischemic   Hypotension   Encephalopathy, metabolic   Length of Stay: 8  SUBJECTIVE  Nonverbal. Net 1.9 L diuresis yesterday with paradoxical improvement in renal parameters. Reported 7 lb weight decrease, still estimated 9 lb above admission.  CURRENT MEDS . ampicillin-sulbactam (UNASYN) IV  1.5 g Intravenous Q6H  . antiseptic oral rinse  7 mL Mouth Rinse BID  . aspirin EC  81 mg Oral Daily  . atorvastatin  10 mg Oral q1800  . clopidogrel  75 mg Oral Daily  . dextrose  1 ampule Intravenous Once  . furosemide  80 mg Oral BID  . guaiFENesin  1,200 mg Oral BID  . heparin subcutaneous  5,000 Units Subcutaneous 3 times per day  . hydrALAZINE  10 mg Oral 3 times per day  . insulin aspart  0-15 Units Subcutaneous TID WC  . insulin glargine  20 Units Subcutaneous Daily  . isosorbide dinitrate  10 mg Oral TID  . potassium chloride  30 mEq Oral BID    OBJECTIVE   Intake/Output Summary (Last 24 hours) at 10/05/15 0955 Last data filed at 10/05/15 0700  Gross per 24 hour  Intake    680 ml  Output   2625 ml  Net  -1945 ml   Filed Weights   10/19/2015 1800 10/04/15 0405 10/05/15 0552  Weight: 203 lb 4.2 oz (92.2 kg) 266 lb 8.6 oz (120.9 kg) 259 lb 11.2 oz (117.8 kg)    PHYSICAL EXAM Filed Vitals:   10/05/15 0400 10/05/15 0552 10/05/15 0600 10/05/15 0747  BP: 125/78  141/91 148/94  Pulse: 84  92 95  Temp:    98.4 F  (36.9 C)  TempSrc:    Oral  Resp: 30  28 27   Height:      Weight:  259 lb 11.2 oz (117.8 kg)    SpO2: 98%  95% 92%   General: Obtunded, no distress Head: no evidence of trauma, PERRL, EOMI, no exophtalmos or lid lag, no myxedema, no xanthelasma; normal ears, nose and oropharynx, dry mucous membranes Neck: normal jugular venous pulsations and no hepatojugular reflux; brisk carotid pulses without delay and no carotid bruits Chest: clear to auscultation, no signs of consolidation by percussion or palpation, normal fremitus, symmetrical and full respiratory excursions Cardiovascular: normal position and quality of the apical impulse, regular rhythm, normal first and second heart sounds, no rubs or gallops, no murmur Abdomen: no tenderness or distention, no masses by palpation, no abnormal pulsatility or arterial bruits, normal bowel sounds, no hepatosplenomegaly Extremities: no clubbing, cyanosis or edema; 2+ radial, ulnar and brachial pulses bilaterally;cannot locate pedal pulses Neurological: right hemiparesis, but very limited cooperation with exam  LABS  CBC  Recent Labs  10/04/15 0555 10/05/15 0251  WBC 11.5* 10.2  HGB 10.4* 11.0*  HCT 32.2* 34.2*  MCV 100.0 100.0  PLT 159 165   Basic Metabolic Panel  Recent Labs  10/04/15 0555 10/05/15 0251  NA 145 148*  K  3.5 3.1*  CL 108 111  CO2 22 25  GLUCOSE 290* 293*  BUN 76* 64*  CREATININE 2.12* 1.86*  CALCIUM 8.5* 8.6*  MG  --  2.3   Liver Function Tests  Recent Labs  10/04/15 0555 10/05/15 0251  AST 56* 55*  ALT 265* 223*  ALKPHOS 62 66  BILITOT 1.4* 1.5*  PROT 6.1* 6.3*  ALBUMIN 2.7* 2.6*   No results for input(s): LIPASE, AMYLASE in the last 72 hours. Cardiac Enzymes No results for input(s): CKTOTAL, CKMB, CKMBINDEX, TROPONINI in the last 72 hours. BNP Invalid input(s): POCBNP D-Dimer No results for input(s): DDIMER in the last 72 hours. Hemoglobin A1C  Recent Labs  10/04/15 0555  HGBA1C 7.4*     Radiology Studies Imaging results have been reviewed and No results found.  TELE NSr   ASSESSMENT AND PLAN   1. Non-ST elevation myocardial infarction- Echocardiogram showed severely reduced LV function. However given his previous CVA, renal insufficiency and overall medical condition he is not a candidate for aggressive evaluation such as cardiac catheterization. - Continue aspirin. Add clopidogrel. - LFTs improving. Add statin today.  - Not on ACE inhibitor given renal insufficiency.  - On low dose hydralazine, add nitrates. BP relatively high, increase vasodilators - No beta blocker in the setting of acute congestive heart failure and significant AV node  2. Acute systolic congestive heart failure/signs of ischemic cardiomyopathy -  - improving renal function and reduction in edema with diuresis. Correct hypokalemia. 3. Abnormal LFTs, likely due to sepsis - improving. Recheck in 2-3 days after addition of statin. 4. History of CVA with residual hemiparesis 5. Acute on chronic renal failure - improved since admission, appears to improve more after diuretics. Most recent assay prior to current illness showed creat 1.5 in 2012. 6. Diabetic ketoacidosis - resolved; not on insulin prior to admission 7. Hypernatremia - increase free water intake. Placed as nursing order since he cannot report thirst or ask for water. Amount may need to be increased 8. Second degree AV block Mobitz 1, RBBB, LAFB - I am not sure that beta blockers will be a good idea since he appears to have advanced AV conduction disease.  9. Reported atrial fibrillation - on review of all the tracings, I do not find any evidence of this. Rhythm was often irregular due to Wenckebach cycles, especially during tachycardia.  Prognosis appears to be poor long-term. Palliative care consult is planned. Patient is a no CODE BLUE.   Thurmon FairMihai Gradyn Shein, MD, [facility] SurgeryFACC [facility] [phone] office [phone]  pager 10/05/2015 9:55 AM

## 2015-10-05 NOTE — Plan of Care (Signed)
Mr. [name] is resting quietly in bed. No family at bedside.  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  He states he and his wife manage "the best we can" at home.  I share that his heart rate got low, and he almost passed.  I ask if this scared him.  He slowly shook his head and said, "no".   We talk about services at home with Hospice.  He tells me that he has not heard about the benefits.  I share that PMT will meet with he and his wife tomorrow.  Call to wife, Chip BoerVicki, left VM to schedule meeting on 1/12.

## 2015-10-05 NOTE — Care Management Important Message (Signed)
Important Message  Patient Details  Name: [name] MRN: [id number] Date of Birth: [birth year]   Medicare Important Message Given:  Yes    [name] [name] 10/05/2015, 12:10 PM

## 2015-10-05 NOTE — Progress Notes (Signed)
[facility] PROGRESS NOTE  [name] ZOX:[id number] DOB: 12/07/1940 DOA: 2015/09/28 PCP: [name], MD  Interim Summary 75 year old male with a history of diabetes mellitus type 2, stroke with residual right hemiparesis presented on 2015-09-28 with shortness of breath, bradycardia and syncope. The patient had a history of diarrhea abdominal pain and generalized malaise for several days prior to admission. CT abdomen and pelvis at the time of admission showed mild sigmoid thickening thought to be due to muscular hypertrophy without any other acute findings. CT of the chest that time showed bilateral pleural effusions. The patient was given atropine 1 mg with improvement of his heart rate. He was noted to be hypotensive and severely hyperglycemic in the emergency department. In addition, his troponins were elevated with EKG changes. The patient responded to fluid resuscitation. Cardiology was consulted for elevated troponins.   Assessment/Plan: #1 acute respiratory failure with hypoxia Likely multifactorial secondary to acute CHF exacerbation/pulmonary edema and pleural effusions in the setting of probable aspiration pneumonia. Patient on 2 L nasal cannula with sats in the 90s. Patient with some coarse rhonchorous breath sounds likely due to aspiration. On 10/04/2015 due to concerns that patient was aspirating given his mental status chest x-ray clinical exam patient was started on IV Unasyn. Chest x-ray from 10/03/2015 consistent with pulmonary edema right lower lobe increased opacity. Patient has been seen by speech therapy and currently on a dysphagia diet. Continue diuretics, empiric IV Unasyn. Follow.  #2 acute systolic heart failure/ischemic cardiomyopathy Questionable etiology. May be secondary to non-STEMI. 2-D echo from 09/29/2015 with a EF of 20-25% with inferolateral akinesia. Patient still with some volume overload on clinical exam. Patient cannot be started on ACE  inhibitor secondary to renal failure. Patient is -1.865 L over the past 24 hours and -1.365 L during this hospitalization. Current weight is 117.8 kg from 120.9 kg on 10/04/2015. Continue aspirin, Plavix, oral diuretics, hydralazine, Isordil. Per cardiology.  #3 probable aspiration pneumonia Patient with probable aspiration per modified barium swallow. Patient would rhonchorous breath sounds. Chest x-ray from 10/03/2015 with increased right lower lobe opacity. Patient currently afebrile. WBC trending down. Continue empiric IV Unasyn. Add Mucinex. Flutter valve. Chest PT.  #4 DKA in the setting of type 2 diabetes   hemoglobin A1c 7.4. Patient initially presented in diabetic ketoacidosis with CBGs of 586 and anion gap of 18. Patient was placed on the glucose stabilizer and subsequently transitioned to subcutaneous insulin. CBGs have ranged from 260-282. Lantus was increased to 20 units yesterday. Continue sliding scale insulin. Continue meal coverage insulin. Follow and titrate as needed.  #5 non-STEMI Patient noted to have a troponin peaking at 30.52. 2-D echo had a EF of 20-25% with diffuse hypokinesis and akinesis of the inferior lateral myocardium with moderate mitral valve regurgitation. Patient was initially started on a heparin drip. Cardiology consulted and currently following. Patient deemed not a candidate for heart catheterization secondary to his comorbidities including previous CVA, renal insufficiency, overall medical condition. Unable to place on ACE inhibitor secondary to renal insufficiency. Unable to place on statin secondary to transaminitis. LFTs trending down. Beta blocker on hold for now per cardiology in the setting of acute CHF exacerbation and patient's significant AV node conduction abnormalities. Continue current medical regimen of aspirin, Plavix, diuretics, hydralazine, isordil.per cardiology.  #6 transaminitis Likely to shock liver/sepsis. Right upper quadrant ultrasound with  fatty liver and negative for cholecystitis. Improving.  #7 history of CVA with residual right hemiparesis Continue aspirin and Plavix for secondary stroke prevention.  PT/OT.  #8 hyponatremia We'll place on gentle D5W at 10 mL per hour and follow.  #9 second-degree AV block Mobitz 1/RBBB/LAFB Per cardiology.  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 Baseline creatinine 1.1-1.4. Renal function slowly trending down. Creatinine peaked at 3.89. Current creatinine currently at 1.86. Continue diuretics.   #11 dysphagia Patient underwent modified barium swallow 10/01/2015. Patient currently on a dysphagia 1 diet with nectar thick liquids. Continue aspiration precautions.  #12 acute metabolic encephalopathy CT head negative for any acute cortical abnormalities, moderate cerebral atrophy with progression from prior exam. EEG which was done showed generalized slowing indicating mild-to-moderate cerebral disturbance. No epileptiform activity noted. Patient alert and following some simple commands on sure baseline. Follow.  #13 prophylaxis Heparin for DVT prophylaxis.   Code Status: DO NOT RESUSCITATE Family Communication: Updated patient. No family at bedside. Disposition Plan: Remain in the stepdown unit.Likely need skilled nursing facility when medically stable.   Consultants:  Cardiology: Dr. Elease HashimotoNahser October 04, 2015  Procedures:  CT head 09/28/2015  CT chest October 04, 2015  CT abdomen and pelvis October 04, 2015  Chest x-ray October 04, 2015, 09/29/2015, 1 60,017, 10/01/2015, 10/03/2015  Modified barium swallow 10/01/2015  2-D echo 09/29/2015   Lower extremity Dopplers 10/03/2015  Right upper quadrant ultrasound 09/29/2015   EEG 09/28/2015  Antibiotics:  IV vancomycin 09/29/2015>>>> 10/01/2015             IV Zosyn October 04, 2015>>>>  10/02/2015  IV Unasyn 10/04/2015  HPI/Subjective: Patient alert. Following simple commands. Some dysarthric speech. Denies CP. C/o some SOB.  Objective: Filed Vitals:   10/05/15 0600 10/05/15 0747  BP: 141/91 148/94  Pulse: 92 95  Temp:  98.4 F (36.9 C)  Resp: 28 27    Intake/Output Summary (Last 24 hours) at 10/05/15 0859 Last data filed at 10/05/15 0700  Gross per 24 hour  Intake    720 ml  Output   2625 ml  Net  -1905 ml   Filed Weights   2016/09/07 1800 10/04/15 0405 10/05/15 0552  Weight: 92.2 kg (203 lb 4.2 oz) 120.9 kg (266 lb 8.6 oz) 117.8 kg (259 lb 11.2 oz)    Exam:   General:  Sitting up in bed  Cardiovascular: Tachycardia  Respiratory: Coarse, rhonchorus BS anterior lung fields.  Abdomen: Soft/NT/ND/+BS  Musculoskeletal: No c/c/. 1-2+ BLE edema  Data Reviewed: Basic Metabolic Panel:  Recent Labs Lab 09/29/15 0300 09/30/15 0305 10/01/15 0601 10/02/15 0235 10/03/15 0310 10/04/15 0555 10/05/15 0251  NA 144 144 144 141 146* 145 148*  K 4.2 3.6 3.5 3.6 3.5 3.5 3.1*  CL 111 111 110 108 110 108 111  CO2 19* 20* 21* 19* 22 22 25   GLUCOSE 69 112* 187* 348* 248* 290* 293*  BUN 63* 72* 65* 66* 73* 76* 64*  CREATININE 2.80* 2.58* 2.26* 2.22* 2.47* 2.12* 1.86*  CALCIUM 9.0 8.8* 8.5* 8.7* 8.8* 8.5* 8.6*  MG 1.8 1.9 1.9  --   --   --  2.3  PHOS 3.7 3.9 4.0  --   --   --   --    Liver Function Tests:  Recent Labs Lab 09/29/15 0300 09/30/15 0305 10/03/15 0310 10/04/15 0555 10/05/15 0251  AST 596* 286* 115* 56* 55*  ALT 693* 578* 370* 265* 223*  ALKPHOS 52 49 61 62 66  BILITOT 1.6* 1.1 1.4* 1.4* 1.5*  PROT 6.2* 5.8* 6.2* 6.1* 6.3*  ALBUMIN 2.9*  3.0* 2.8* 2.7* 2.6*   No results for input(s): LIPASE, AMYLASE in the last 168 hours.  Recent Labs Lab 09/30/15 1020  AMMONIA 33   CBC:  Recent Labs Lab 10/01/15 0601 10/02/15 0235 10/03/15 0310 10/04/15 0555 10/05/15 0251  WBC 8.3 10.2 12.3* 11.5* 10.2  HGB 9.7* 10.7* 10.2* 10.4* 11.0*   HCT 29.9* 33.5* 31.0* 32.2* 34.2*  MCV 96.8 98.2 96.9 100.0 100.0  PLT 138* 138* 150 159 165   Cardiac Enzymes:  Recent Labs Lab 09/29/15 1541 09/29/15 2123 09/30/15 1020 09/30/15 1625 09/30/15 2059  TROPONINI 28.81* 28.78* 26.19* 19.73* 18.77*   BNP (last 3 results)  Recent Labs  10/03/15 0310  BNP 2538.6*    ProBNP (last 3 results) No results for input(s): PROBNP in the last 8760 hours.  CBG:  Recent Labs Lab 10/04/15 0744 10/04/15 1136 10/04/15 1625 10/04/15 2052 10/05/15 0749  GLUCAP 266* 291* 282* 260* 279*    Recent Results (from the past 240 hour(s))  Blood Culture (routine x 2)     Status: None   Collection Time: 10/24/2015  4:42 PM  Result Value Ref Range Status   Specimen Description BLOOD LEFT HAND  Final   Special Requests IN PEDIATRIC BOTTLE 1CC  Final   Culture NO GROWTH 5 DAYS  Final   Report Status 10/02/2015 FINAL  Final  Blood Culture (routine x 2)     Status: None   Collection Time: 09/26/2015  4:58 PM  Result Value Ref Range Status   Specimen Description BLOOD LEFT FOREARM  Final   Special Requests BOTTLES DRAWN AEROBIC AND ANAEROBIC 5CC  Final   Culture  Setup Time   Final    GRAM POSITIVE COCCI IN CLUSTERS ANAEROBIC BOTTLE ONLY CRITICAL RESULT CALLED TO, READ BACK BY AND VERIFIED WITH: Candy Sledge RN 1530 09/28/15 A BROWNING    Culture   Final    STAPHYLOCOCCUS SPECIES (COAGULASE NEGATIVE) THE SIGNIFICANCE OF ISOLATING THIS ORGANISM FROM A SINGLE SET OF BLOOD CULTURES WHEN MULTIPLE SETS ARE DRAWN IS UNCERTAIN. PLEASE NOTIFY THE MICROBIOLOGY DEPARTMENT WITHIN ONE WEEK IF SPECIATION AND SENSITIVITIES ARE REQUIRED.    Report Status 09/30/2015 FINAL  Final  MRSA PCR Screening     Status: None   Collection Time: 10/17/2015  6:28 PM  Result Value Ref Range Status   MRSA by PCR NEGATIVE NEGATIVE Final    Comment:        The GeneXpert MRSA Assay (FDA approved for NASAL specimens only), is one component of a comprehensive MRSA  colonization surveillance program. It is not intended to diagnose MRSA infection nor to guide or monitor treatment for MRSA infections.   Urine culture     Status: None   Collection Time: 09/28/15  7:16 PM  Result Value Ref Range Status   Specimen Description URINE, CLEAN CATCH  Final   Special Requests NONE  Final   Culture NO GROWTH 2 DAYS  Final   Report Status 09/30/2015 FINAL  Final  Culture, blood (Routine X 2) w Reflex to ID Panel     Status: None (Preliminary result)   Collection Time: 09/30/15 10:20 AM  Result Value Ref Range Status   Specimen Description BLOOD LEFT HAND  Final   Special Requests IN PEDIATRIC BOTTLE 2CC  Final   Culture NO GROWTH 4 DAYS  Final   Report Status PENDING  Incomplete  Culture, blood (Routine X 2) w Reflex to ID Panel     Status: None (Preliminary result)   Collection Time:  09/30/15 10:27 AM  Result Value Ref Range Status   Specimen Description BLOOD LEFT HAND  Final   Special Requests IN PEDIATRIC BOTTLE 3CC  Final   Culture NO GROWTH 4 DAYS  Final   Report Status PENDING  Incomplete  Urine culture     Status: None   Collection Time: 10/03/15  1:39 PM  Result Value Ref Range Status   Specimen Description URINE, CLEAN CATCH  Final   Special Requests NONE  Final   Culture NO GROWTH 1 DAY  Final   Report Status 10/04/2015 FINAL  Final     Studies: Dg Chest Port 1 View  10/03/2015  CLINICAL DATA:  Acute respiratory failure with hypoxia EXAM: PORTABLE CHEST 1 VIEW COMPARISON:  Two days ago FINDINGS: Unchanged diffuse interstitial opacity with hazy appearance of the perihilar and basilar lungs. Unchanged cardiomegaly and vascular pedicle widening. Small pleural effusions are likely. No pneumothorax. IMPRESSION: Stable CHF pattern. Electronically Signed   By: Marnee Spring M.D.   On: 10/03/2015 09:13    Scheduled Meds: . ampicillin-sulbactam (UNASYN) IV  1.5 g Intravenous Q6H  . antiseptic oral rinse  7 mL Mouth Rinse BID  . aspirin  325 mg  Oral Daily  . clopidogrel  75 mg Oral Daily  . dextrose  1 ampule Intravenous Once  . furosemide  80 mg Oral BID  . heparin subcutaneous  5,000 Units Subcutaneous 3 times per day  . hydrALAZINE  10 mg Oral 3 times per day  . insulin aspart  0-15 Units Subcutaneous TID WC  . insulin glargine  20 Units Subcutaneous Daily  . isosorbide dinitrate  10 mg Oral TID  . potassium chloride  40 mEq Oral Once   Continuous Infusions: . dextrose 10 mL/hr at 10/05/15 0802  . diltiazem (CARDIZEM) infusion Stopped (10/03/15 1045)    Principal Problem:   Acute respiratory failure with hypoxia (HCC) Active Problems:   NSTEMI (non-ST elevated myocardial infarction) (HCC)   Acute systolic CHF (congestive heart failure) (HCC)   DKA (diabetic ketoacidoses) (HCC)   Acute respiratory failure (HCC)   Paroxysmal atrial fibrillation (HCC)   Dysphagia   Acute renal failure superimposed on stage 3 chronic kidney disease (HCC)   Elevated transaminase level   Wenckebach second degree AV block   Aspiration pneumonia (HCC)   Cardiomyopathy, ischemic   Hypotension   Encephalopathy, metabolic    Time spent: 40 mins    [facility] MD [facility] Pager [phone]. If 7PM-7AM, please contact night-coverage at [email or web address], password [facility] 10/05/2015, 8:59 AM  LOS: 8 days

## 2015-10-06 DIAGNOSIS — E870 Hyperosmolality and hypernatremia: Secondary | ICD-10-CM | POA: Insufficient documentation

## 2015-10-06 LAB — C DIFFICILE QUICK SCREEN W PCR REFLEX
C DIFFICILE (CDIFF) INTERP: NEGATIVE
C DIFFICILE (CDIFF) TOXIN: NEGATIVE
C Diff antigen: NEGATIVE

## 2015-10-06 LAB — BASIC METABOLIC PANEL
ANION GAP: 13 (ref 5–15)
BUN: 57 mg/dL — ABNORMAL HIGH (ref 6–20)
CHLORIDE: 111 mmol/L (ref 101–111)
CO2: 27 mmol/L (ref 22–32)
Calcium: 8.6 mg/dL — ABNORMAL LOW (ref 8.9–10.3)
Creatinine, Ser: 1.69 mg/dL — ABNORMAL HIGH (ref 0.61–1.24)
GFR calc Af Amer: 44 mL/min — ABNORMAL LOW (ref >60)
GFR, EST NON AFRICAN AMERICAN: 38 mL/min — AB (ref >60)
GLUCOSE: 224 mg/dL — AB (ref 65–99)
POTASSIUM: 3.5 mmol/L (ref 3.5–5.1)
Sodium: 151 mmol/L — ABNORMAL HIGH (ref 135–145)

## 2015-10-06 LAB — CBC
HEMATOCRIT: 37 % — AB (ref 39.0–52.0)
HEMOGLOBIN: 11.6 g/dL — AB (ref 13.0–17.0)
MCH: 31.2 pg (ref 26.0–34.0)
MCHC: 31.4 g/dL (ref 30.0–36.0)
MCV: 99.5 fL (ref 78.0–100.0)
Platelets: 170 10*3/uL (ref 150–400)
RBC: 3.72 MIL/uL — AB (ref 4.22–5.81)
RDW: 14.5 % (ref 11.5–15.5)
WBC: 10 10*3/uL (ref 4.0–10.5)

## 2015-10-06 LAB — GLUCOSE, CAPILLARY
GLUCOSE-CAPILLARY: 352 mg/dL — AB (ref 65–99)
Glucose-Capillary: 229 mg/dL — ABNORMAL HIGH (ref 65–99)
Glucose-Capillary: 316 mg/dL — ABNORMAL HIGH (ref 65–99)
Glucose-Capillary: 273 mg/dL — ABNORMAL HIGH (ref 65–99)

## 2015-10-06 LAB — MAGNESIUM: Magnesium: 2 mg/dL (ref 1.7–2.4)

## 2015-10-06 MED ORDER — FREE WATER: 250.0000 mL | Freq: Three times a day (TID) | Status: DC

## 2015-10-06 MED ORDER — FREE WATER
250.0000 mL | Freq: Four times a day (QID) | Status: DC
  Administered 2015-10-06 – 2015-10-09 (×13): 250 mL via ORAL

## 2015-10-06 MED ORDER — INSULIN GLARGINE 100 UNIT/ML ~~LOC~~ SOLN
25.0000 [IU] | Freq: Every day | SUBCUTANEOUS | Status: DC
  Administered 2015-10-07: 25 [IU] via SUBCUTANEOUS
  Filled 2015-10-06: qty 0.25

## 2015-10-06 MED ORDER — FUROSEMIDE 80 MG PO TABS: 80.0000 mg | ORAL_TABLET | Freq: Two times a day (BID) | ORAL | Status: DC

## 2015-10-06 MED ORDER — INSULIN GLARGINE 100 UNIT/ML ~~LOC~~ SOLN
5.0000 [IU] | Freq: Once | SUBCUTANEOUS | Status: AC
  Administered 2015-10-06: 5 [IU] via SUBCUTANEOUS
  Filled 2015-10-06 (×2): qty 0.05

## 2015-10-06 NOTE — Progress Notes (Addendum)
[facility] PROGRESS NOTE  [name] ZOX:[id number] DOB: [birth year] DOA: 10/03/2015 PCP: Lolita PatellaEADE,ROBERT ALEXANDER, MD  Interim Summary 75 year old male with a history of diabetes mellitus type 2, stroke with residual right hemiparesis presented on December 24, 2015 with shortness of breath, bradycardia and syncope. The patient had a history of diarrhea abdominal pain and generalized malaise for several days prior to admission. CT abdomen and pelvis at the time of admission showed mild sigmoid thickening thought to be due to muscular hypertrophy without any other acute findings. CT of the chest that time showed bilateral pleural effusions. The patient was given atropine 1 mg with improvement of his heart rate. He was noted to be hypotensive and severely hyperglycemic in the emergency department. In addition, his troponins were elevated with EKG changes. The patient responded to fluid resuscitation. Cardiology was consulted for elevated troponins.   Assessment/Plan: #1 acute respiratory failure with hypoxia Likely multifactorial secondary to acute CHF exacerbation/pulmonary edema and pleural effusions in the setting of probable aspiration pneumonia. Patient on 2 L nasal cannula with sats in the 99. Patient with some coarse rhonchorous breath sounds likely due to aspiration. On 10/04/2015 due to concerns that patient was aspirating given his mental status chest x-ray clinical exam patient was started on IV Unasyn. Chest x-ray from 10/03/2015 consistent with pulmonary edema right lower lobe increased opacity. Patient has been seen by speech therapy and currently on a dysphagia diet. Continue diuretics, empiric IV Unasyn. Follow.  #2 acute systolic heart failure/ischemic cardiomyopathy Questionable etiology. May be secondary to non-STEMI. 2-D echo from 09/29/2015 with a EF of 20-25% with inferolateral akinesia. Patient still with some volume overload on clinical exam. Patient cannot be started on ACE  inhibitor secondary to renal failure. Patient is -1.235 L over the past 24 hours and -2.6 L during this hospitalization. Current weight is 114.1kg from 117.8 kg from 120.9 kg on 10/04/2015. Continue aspirin, Plavix,  hydralazine, Isordil. Hold oral diuretics today secondary to hypernatremia. Per cardiology.  #3 probable aspiration pneumonia Patient with probable aspiration per modified barium swallow. Patient would rhonchorous breath sounds. Chest x-ray from 10/03/2015 with increased right lower lobe opacity. Patient currently afebrile. WBC trending down. Continue empiric IV Unasyn, Mucinex. Flutter valve. Chest PT.  #4 DKA in the setting of type 2 diabetes   hemoglobin A1c 7.4. Patient initially presented in diabetic ketoacidosis with CBGs of 586 and anion gap of 18. Patient was placed on the glucose stabilizer and subsequently transitioned to subcutaneous insulin. CBGs have ranged from 198-229. Increase Lantus to 24 units. Continue sliding scale insulin. Continue meal coverage insulin. Follow and titrate as needed.  #5 non-STEMI Patient noted to have a troponin peaking at 30.52. 2-D echo had a EF of 20-25% with diffuse hypokinesis and akinesis of the inferior lateral myocardium with moderate mitral valve regurgitation. Patient was initially started on a heparin drip. Cardiology consulted and currently following. Patient deemed not a candidate for heart catheterization secondary to his comorbidities including previous CVA, renal insufficiency, overall medical condition. Unable to place on ACE inhibitor secondary to renal insufficiency. Unable to place on statin secondary to transaminitis. LFTs trending down. Beta blocker on hold for now per cardiology in the setting of acute CHF exacerbation and patient's significant AV node conduction abnormalities. Continue current medical regimen of aspirin, Plavix, diuretics, hydralazine, isordil. Per cardiology.  #6 transaminitis Likely to shock liver/sepsis. Right  upper quadrant ultrasound with fatty liver and negative for cholecystitis. Improving.  #7 history of CVA with residual right hemiparesis Continue aspirin  and Plavix for secondary stroke prevention. PT/OT.  #8 hypernatremia Increase D5W to 50 mL per hour and follow. Hold lasix today.  #9 second-degree AV block Mobitz 1/RBBB/LAFB Per cardiology.  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 Baseline creatinine 1.1-1.4. Renal function slowly trending down. Creatinine peaked at 3.89. Current creatinine currently at 1.69. Hold diuretics today secondary to hypernatremia.   #11 dysphagia Patient underwent modified barium swallow 10/01/2015. Patient currently on a dysphagia 1 diet with nectar thick liquids. Continue aspiration precautions.  #12 acute metabolic encephalopathy CT head negative for any acute cortical abnormalities, moderate cerebral atrophy with progression from prior exam. EEG which was done showed generalized slowing indicating mild-to-moderate cerebral disturbance. No epileptiform activity noted. Patient alert and following some simple commands, likely at baseline. Follow.  #13 prophylaxis Heparin for DVT prophylaxis.   Code Status: DO NOT RESUSCITATE Family Communication: Updated patient. No family at bedside. Disposition Plan: Remain in the stepdown unit pending palliative care evaluation.   Consultants:  Cardiology: Dr. Elease Hashimoto 10/14/2015  Procedures:  CT head 09/28/2015  CT chest 09/29/2015  CT abdomen and pelvis 10/11/2015  Chest x-ray 10/10/2015, 09/29/2015, 1 60,017, 10/01/2015, 10/03/2015  Modified barium swallow 10/01/2015  2-D echo 09/29/2015   Lower extremity Dopplers 10/03/2015  Right upper quadrant ultrasound 09/29/2015   EEG 09/28/2015  Antibiotics:  IV vancomycin 09/29/2015>>>>  10/01/2015             IV Zosyn 10/24/2015>>>> 10/02/2015  IV Unasyn 10/04/2015  HPI/Subjective: Patient alert. Following simple commands. Patient states he's thirsty asking for water. Some dysarthric speech. Denies CP. States SOB improving.  Objective: Filed Vitals:   10/06/15 0600 10/06/15 0727  BP: 123/78 133/75  Pulse: 70 74  Temp:  97.5 F (36.4 C)  Resp:  12    Intake/Output Summary (Last 24 hours) at 10/06/15 0917 Last data filed at 10/06/15 0600  Gross per 24 hour  Intake   1420 ml  Output   2725 ml  Net  -1305 ml   Filed Weights   10/04/15 0405 10/05/15 0552 10/06/15 0300  Weight: 120.9 kg (266 lb 8.6 oz) 117.8 kg (259 lb 11.2 oz) 114.1 kg (251 lb 8.7 oz)    Exam:   General:  Sitting up in bed  Cardiovascular: RRR  Respiratory: Coarse, less rhonchorus BS anterior lung fields.  Abdomen: Soft/NT/ND/+BS  Musculoskeletal: No c/c/. 1+ BLE edema  Data Reviewed: Basic Metabolic Panel:  Recent Labs Lab 09/30/15 0305 10/01/15 0601 10/02/15 0235 10/03/15 0310 10/04/15 0555 10/05/15 0251 10/06/15 0320  NA 144 144 141 146* 145 148* 151*  K 3.6 3.5 3.6 3.5 3.5 3.1* 3.5  CL 111 110 108 110 108 111 111  CO2 20* 21* 19* 22 22 25 27   GLUCOSE 112* 187* 348* 248* 290* 293* 224*  BUN 72* 65* 66* 73* 76* 64* 57*  CREATININE 2.58* 2.26* 2.22* 2.47* 2.12* 1.86* 1.69*  CALCIUM 8.8* 8.5* 8.7* 8.8* 8.5* 8.6* 8.6*  MG 1.9 1.9  --   --   --  2.3 2.0  PHOS 3.9 4.0  --   --   --   --   --    Liver Function Tests:  Recent Labs Lab 09/30/15 0305 10/03/15 0310 10/04/15 0555 10/05/15 0251  AST 286* 115* 56* 55*  ALT 578* 370* 265* 223*  ALKPHOS 49 61 62 66  BILITOT 1.1 1.4* 1.4* 1.5*  PROT 5.8* 6.2* 6.1* 6.3*  ALBUMIN 3.0* 2.8* 2.7* 2.6*   No results for input(s): LIPASE, AMYLASE in the last 168 hours.  Recent Labs Lab 09/30/15 1020  AMMONIA 33   CBC:  Recent Labs Lab 10/02/15 0235 10/03/15 0310 10/04/15 0555 10/05/15 0251 10/06/15 0320  WBC 10.2  12.3* 11.5* 10.2 10.0  HGB 10.7* 10.2* 10.4* 11.0* 11.6*  HCT 33.5* 31.0* 32.2* 34.2* 37.0*  MCV 98.2 96.9 100.0 100.0 99.5  PLT 138* 150 159 165 170   Cardiac Enzymes:  Recent Labs Lab 09/29/15 1541 09/29/15 2123 09/30/15 1020 09/30/15 1625 09/30/15 2059  TROPONINI 28.81* 28.78* 26.19* 19.73* 18.77*   BNP (last 3 results)  Recent Labs  10/03/15 0310  BNP 2538.6*    ProBNP (last 3 results) No results for input(s): PROBNP in the last 8760 hours.  CBG:  Recent Labs Lab 10/05/15 0749 10/05/15 1228 10/05/15 1712 10/05/15 2056 10/06/15 0729  GLUCAP 279* 240* 198* 208* 229*    Recent Results (from the past 240 hour(s))  Blood Culture (routine x 2)     Status: None   Collection Time: 2015-10-07  4:42 PM  Result Value Ref Range Status   Specimen Description BLOOD LEFT HAND  Final   Special Requests IN PEDIATRIC BOTTLE 1CC  Final   Culture NO GROWTH 5 DAYS  Final   Report Status 10/02/2015 FINAL  Final  Blood Culture (routine x 2)     Status: None   Collection Time: 10/07/15  4:58 PM  Result Value Ref Range Status   Specimen Description BLOOD LEFT FOREARM  Final   Special Requests BOTTLES DRAWN AEROBIC AND ANAEROBIC 5CC  Final   Culture  Setup Time   Final    GRAM POSITIVE COCCI IN CLUSTERS ANAEROBIC BOTTLE ONLY CRITICAL RESULT CALLED TO, READ BACK BY AND VERIFIED WITH: Candy Sledge RN 1530 09/28/15 A BROWNING    Culture   Final    STAPHYLOCOCCUS SPECIES (COAGULASE NEGATIVE) THE SIGNIFICANCE OF ISOLATING THIS ORGANISM FROM A SINGLE SET OF BLOOD CULTURES WHEN MULTIPLE SETS ARE DRAWN IS UNCERTAIN. PLEASE NOTIFY THE MICROBIOLOGY DEPARTMENT WITHIN ONE WEEK IF SPECIATION AND SENSITIVITIES ARE REQUIRED.    Report Status 09/30/2015 FINAL  Final  MRSA PCR Screening     Status: None   Collection Time: 2015-10-07  6:28 PM  Result Value Ref Range Status   MRSA by PCR NEGATIVE NEGATIVE Final    Comment:        The GeneXpert MRSA Assay (FDA approved for NASAL specimens only), is  one component of a comprehensive MRSA colonization surveillance program. It is not intended to diagnose MRSA infection nor to guide or monitor treatment for MRSA infections.   Urine culture     Status: None   Collection Time: 09/28/15  7:16 PM  Result Value Ref Range Status   Specimen Description URINE, CLEAN CATCH  Final   Special Requests NONE  Final   Culture NO GROWTH 2 DAYS  Final   Report Status 09/30/2015 FINAL  Final  Culture, blood (Routine X 2) w Reflex to ID Panel     Status: None   Collection Time: 09/30/15 10:20 AM  Result Value Ref Range Status   Specimen Description BLOOD LEFT HAND  Final   Special Requests IN PEDIATRIC BOTTLE 2CC  Final   Culture NO GROWTH 5 DAYS  Final   Report Status 10/05/2015 FINAL  Final  Culture, blood (Routine X 2) w Reflex to ID Panel     Status: None  Collection Time: 09/30/15 10:27 AM  Result Value Ref Range Status   Specimen Description BLOOD LEFT HAND  Final   Special Requests IN PEDIATRIC BOTTLE 3CC  Final   Culture NO GROWTH 5 DAYS  Final   Report Status 10/05/2015 FINAL  Final  Urine culture     Status: None   Collection Time: 10/03/15  1:39 PM  Result Value Ref Range Status   Specimen Description URINE, CLEAN CATCH  Final   Special Requests NONE  Final   Culture NO GROWTH 1 DAY  Final   Report Status 10/04/2015 FINAL  Final  C difficile quick scan w PCR reflex     Status: None   Collection Time: 10/05/15 10:30 PM  Result Value Ref Range Status   C Diff antigen NEGATIVE NEGATIVE Final   C Diff toxin NEGATIVE NEGATIVE Final   C Diff interpretation Negative for toxigenic C. difficile  Final     Studies: No results found.  Scheduled Meds: . ampicillin-sulbactam (UNASYN) IV  1.5 g Intravenous Q6H  . antiseptic oral rinse  7 mL Mouth Rinse BID  . aspirin EC  81 mg Oral Daily  . atorvastatin  10 mg Oral q1800  . clopidogrel  75 mg Oral Daily  . dextrose  1 ampule Intravenous Once  . free water  250 mL Oral 3 times per  day  . furosemide  80 mg Oral BID  . guaiFENesin  1,200 mg Oral BID  . heparin subcutaneous  5,000 Units Subcutaneous 3 times per day  . hydrALAZINE  10 mg Oral 3 times per day  . insulin aspart  0-15 Units Subcutaneous TID WC  . insulin glargine  20 Units Subcutaneous Daily  . isosorbide dinitrate  10 mg Oral TID  . potassium chloride  30 mEq Oral BID   Continuous Infusions: . dextrose 50 mL/hr at 10/06/15 0755  . diltiazem (CARDIZEM) infusion Stopped (10/03/15 1045)    Principal Problem:   Acute respiratory failure with hypoxia (HCC) Active Problems:   NSTEMI (non-ST elevated myocardial infarction) (HCC)   Acute systolic CHF (congestive heart failure) (HCC)   DKA (diabetic ketoacidoses) (HCC)   Acute respiratory failure (HCC)   Paroxysmal atrial fibrillation (HCC)   Dysphagia   Acute renal failure superimposed on stage 3 chronic kidney disease (HCC)   Elevated transaminase level   Wenckebach second degree AV block   Aspiration pneumonia (HCC)   Cardiomyopathy, ischemic   Hypotension   Encephalopathy, metabolic   Palliative care encounter    Time spent: 40 mins    [facility] MD [facility] Pager [phone]. If 7PM-7AM, please contact night-coverage at [email or web address], password [facility] 10/06/2015, 9:17 AM  LOS: 9 days

## 2015-10-06 NOTE — Progress Notes (Signed)
Speech Language Pathology Treatment: Dysphagia  Patient Details Name: Lockie Pareslfred K Suits MRN: [id number] DOB: 12/27/1940 Today's Date: 10/06/2015 Time: [id number] SLP Time Calculation (min) (ACUTE ONLY): 14 min  Assessment / Plan / Recommendation Clinical Impression  SLP provided skilled observation of nectar thick liquids and puree. Mouth breathing was evident upon arrival. Oral stage deficit of oral holding was observed. Pt required maximum verbal, visual and tactile cueing in order to initiate swallow. S/s of penetration/aspiration were noted, as pt coughed twice after continuous large sips of nectar thick liquids. Pt was cued to take smaller sips and this appeared to prevent further coughing episodes. Pt was unable to produce a strong throat clear to trigger reflexive swallow. Pt was educated on compensatory strategies of small sips/bites. Continue Dys 1, nectar thick liquids. Overall prognosis unknown; more alert today. Aspiration likely to continue. Will f/u for education re: swallow strategies to mitigate pt's risk.    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 Pt found to have NSTEMI, acute renal failure , hyperkalemia, sepsis. Pt did not require intubation. CXR congestive heart failure with pulmonary interstitial edema and small right pleural effusion. Overall there has been interval improvement since yesterday's study. Pt' s wife states pt had dysphagia at time of stroke but having "difficulty swallowing over that past month."      SLP Plan  Continue with current plan of care     Recommendations  Diet recommendations: Dysphagia 1 (puree);Nectar-thick liquid Liquids provided via: Cup;No straw Medication Administration: Crushed with puree Supervision: Full supervision/cueing for compensatory strategies;Staff to assist with self  feeding Compensations: Slow rate;Small sips/bites;Lingual sweep for clearance of pocketing;Clear throat intermittently (Cue to initiate the swallow) Postural Changes and/or Swallow Maneuvers: Seated upright 90 degrees             Oral Care Recommendations: Oral care BID Follow up Recommendations: 24 hour supervision/assistance Plan: Continue with current plan of care     GO                Jagdeep Ancheta 10/06/2015, 2:03 PM   Lynita LombardLauren Dajuan Turnley, Student-SLP

## 2015-10-06 NOTE — Evaluation (Signed)
Physical Therapy Evaluation Patient Details Name: [name] MRN: [id number] DOB: [birth year] Today's Date: 10/06/2015   History of Present Illness  This is a 75 yo white male with a h/o Type 2 DM and CVA With residual right-sided weakness who presents via EMS with SOB, syncope and bradycardia had CBG's in 500's and positive for NSTEMI.  Clinical Impression  Patient presents with decreased mobility due to deficits listed in PT problem list and will benefit from skilled PT in the acute setting to maximize mobility for eventual d/c home with wife assist following CIR level rehab stay.  Currently with difficulty with all mobility and unable to transfer or scoot along EOB with max A.  Feel he may progress to tolerate intensity of CIR program and allow d/c home.    Follow Up Recommendations CIR    Equipment Recommendations  None recommended by PT    Recommendations for Other Services Rehab consult     Precautions / Restrictions Precautions Precautions: Fall Precaution Comments: R HP      Mobility  Bed Mobility Overal bed mobility: Needs Assistance Bed Mobility: Rolling;Sidelying to Sit;Sit to Supine Rolling: Mod assist Sidelying to sit: Max assist   Sit to supine: +2 for physical assistance;Total assist   General bed mobility comments: cues and assist to reach for rail, assist to bring hips over with pad and for feet off bed, assist to lift trunk; RN assisted with return to supine for trunk and I assisted with legs, positioned on R side with pillows for comfort  Transfers Overall transfer level: Needs assistance   Transfers: Lateral/Scoot Transfers          Lateral/Scoot Transfers: Max assist General transfer comment: unable to laterally scoot to head of bed with max A (unable to clear hips with scoot/squat pivot)  Ambulation/Gait                Stairs            Wheelchair Mobility    Modified Rankin (Stroke Patients Only)       Balance  Overall balance assessment: Needs assistance Sitting-balance support: Single extremity supported;Feet supported Sitting balance-Leahy Scale: Poor Sitting balance - Comments: falls to R in sitting and anteriorly due to weak trunk and mod support at times for sitting balance; tolerated trunk rotation and cervical rotation AAROM in sitting and able to sit momentarily with supervision (up to 20 sec) Postural control: Right lateral lean                                   Pertinent Vitals/Pain Faces Pain Scale: Hurts little more Pain Location: bottom Pain Descriptors / Indicators: Sore Pain Intervention(s): Repositioned;Monitored during session    Home Living Family/patient expects to be discharged to:: Skilled nursing facility Living Arrangements: Spouse/significant other Available Help at Discharge: Family Type of Home: House Home Access: Ramped entrance     Home Layout: One level Home Equipment: Cane - quad      Prior Function Level of Independence: Needs assistance   Gait / Transfers Assistance Needed: reports wife helped him walke with quad cane  ADL's / Homemaking Assistance Needed: states wife helped him dress        Hand Dominance        Extremity/Trunk Assessment   Upper Extremity Assessment: Defer to OT evaluation (nonfunctional R UE)           Lower Extremity Assessment: RLE  deficits/detail;LLE deficits/detail RLE Deficits / Details: AAROM WFL, strength hip flex/knee ext 1/5, ankle DF/PF 2+/5 LLE Deficits / Details: AAROM FWL, strength at least 3/5  Cervical / Trunk Assessment: Kyphotic;Other exceptions  Communication   Communication: No difficulties  Cognition Arousal/Alertness: Awake/alert Behavior During Therapy: Flat affect Overall Cognitive Status: No family/caregiver present to determine baseline cognitive functioning                      General Comments      Exercises General Exercises - Upper Extremity Shoulder  Flexion: PROM;AROM;Right;Left;5 reps;Supine Elbow Flexion: PROM;AROM;Right;Left;5 reps;Supine Wrist Flexion: PROM;Right;5 reps;Supine Wrist Extension: PROM;Right;5 reps;Supine General Exercises - Lower Extremity Ankle Circles/Pumps: AROM;Both;5 reps;Supine Short Arc Quad: AROM;Left;5 reps;Supine Heel Slides: PROM;AAROM;Right;Left;5 reps;Supine      Assessment/Plan    PT Assessment Patient needs continued PT services  PT Diagnosis Generalized weakness   PT Problem List Decreased strength;Decreased activity tolerance;Decreased balance;Decreased mobility;Decreased knowledge of use of DME;Decreased safety awareness  PT Treatment Interventions DME instruction;Balance training;Functional mobility training;Patient/family education;Therapeutic activities;Therapeutic exercise   PT Goals (Current goals can be found in the Care Plan section) Acute Rehab PT Goals Patient Stated Goal: To go to rehab, get stronger PT Goal Formulation: With family Time For Goal Achievement: 10/20/15 Potential to Achieve Goals: Fair    Frequency Min 3X/week   Barriers to discharge        Co-evaluation               End of Session Equipment Utilized During Treatment: Gait belt Activity Tolerance: Patient limited by fatigue Patient left: in bed;with call bell/phone within reach           Time: 1010-1045 PT Time Calculation (min) (ACUTE ONLY): 35 min   Charges:   PT Evaluation $PT Eval High Complexity: 1 Procedure PT Treatments $Therapeutic Activity: 8-22 mins   PT G CodesElray Noble:        [name] 10/06/2015, 12:25 PM [name], PT [phone] 10/06/2015

## 2015-10-06 NOTE — Progress Notes (Signed)
Patient Name: [name] Date of Encounter: 10/06/2015  Principal Problem:   Acute respiratory failure with hypoxia (HCC) Active Problems:   DKA (diabetic ketoacidoses) (HCC)   Acute respiratory failure (HCC)   NSTEMI (non-ST elevated myocardial infarction) (HCC)   Acute systolic CHF (congestive heart failure) (HCC)   Paroxysmal atrial fibrillation (HCC)   Dysphagia   Acute renal failure superimposed on stage 3 chronic kidney disease (HCC)   Elevated transaminase level   Wenckebach second degree AV block   Aspiration pneumonia (HCC)   Cardiomyopathy, ischemic   Hypotension   Encephalopathy, metabolic   Palliative care encounter   Length of Stay: 9  SUBJECTIVE  He is much more alert today and asked me for ice water, he is aware of his name, and circumstances. Worsened hypernatremia, fortunately now alert and with active thirst mechanism, able to swallow thickened water. This will allow correction of Na imbalance. His diuretic is temporarily on hold. Renal function remains abnormal, but improving and approaching apparent baseline creat 1.5 from 2012.  CURRENT MEDS . ampicillin-sulbactam (UNASYN) IV  1.5 g Intravenous Q6H  . antiseptic oral rinse  7 mL Mouth Rinse BID  . aspirin EC  81 mg Oral Daily  . atorvastatin  10 mg Oral q1800  . clopidogrel  75 mg Oral Daily  . dextrose  1 ampule Intravenous Once  . free water  250 mL Oral Q6H  . [START ON 10/07/2015] furosemide  80 mg Oral BID  . guaiFENesin  1,200 mg Oral BID  . heparin subcutaneous  5,000 Units Subcutaneous 3 times per day  . hydrALAZINE  10 mg Oral 3 times per day  . insulin aspart  0-15 Units Subcutaneous TID WC  . insulin glargine  20 Units Subcutaneous Daily  . isosorbide dinitrate  10 mg Oral TID  . potassium chloride  30 mEq Oral BID    OBJECTIVE   Intake/Output Summary (Last 24 hours) at 10/06/15 1543 Last data filed at 10/06/15 1400  Gross per 24 hour  Intake   1660 ml  Output   1950 ml  Net    -290 ml   Filed Weights   10/04/15 0405 10/05/15 0552 10/06/15 0300  Weight: 266 lb 8.6 oz (120.9 kg) 259 lb 11.2 oz (117.8 kg) 251 lb 8.7 oz (114.1 kg)    PHYSICAL EXAM Filed Vitals:   10/06/15 1000 10/06/15 1134 10/06/15 1200 10/06/15 1400  BP: 119/70 127/75 125/71 143/71  Pulse: 89 79 76 82  Temp:  97.6 F (36.4 C)    TempSrc:  Oral    Resp: 19 16 11 12   Height:      Weight:      SpO2: 95% 96% 95% 96%   General: Alert, oriented x 2, no distress Head: no evidence of trauma, PERRL, EOMI, no exophtalmos or lid lag, no myxedema, no xanthelasma; normal ears, nose and oropharynx, dry mucous membranes Neck: normal jugular venous pulsations and no hepatojugular reflux; brisk carotid pulses without delay and no carotid bruits Chest: clear to auscultation, no signs of consolidation by percussion or palpation, normal fremitus, symmetrical and full respiratory excursions Cardiovascular: normal position and quality of the apical impulse, regular rhythm, normal first and second heart sounds, no rubs or gallops, no murmur Abdomen: no tenderness or distention, no masses by palpation, no abnormal pulsatility or arterial bruits, normal bowel sounds, no hepatosplenomegaly Extremities: no clubbing, cyanosis or edema; 2+ radial, ulnar and brachial pulses bilaterally;cannot locate pedal pulses Neurological: right hemiparesis  LABS  CBC  Recent Labs  10/05/15 0251 10/06/15 0320  WBC 10.2 10.0  HGB 11.0* 11.6*  HCT 34.2* 37.0*  MCV 100.0 99.5  PLT 165 170   Basic Metabolic Panel  Recent Labs  10/05/15 0251 10/06/15 0320  NA 148* 151*  K 3.1* 3.5  CL 111 111  CO2 25 27  GLUCOSE 293* 224*  BUN 64* 57*  CREATININE 1.86* 1.69*  CALCIUM 8.6* 8.6*  MG 2.3 2.0   Liver Function Tests  Recent Labs  10/04/15 0555 10/05/15 0251  AST 56* 55*  ALT 265* 223*  ALKPHOS 62 66  BILITOT 1.4* 1.5*  PROT 6.1* 6.3*  ALBUMIN 2.7* 2.6*  Hemoglobin A1C  Recent Labs  10/04/15 0555   HGBA1C 7.4*    TELE NSR   ASSESSMENT AND PLAN  1. Non-ST elevation myocardial infarction- Echocardiogram showed severely reduced LV function. However given his previous CVA, renal insufficiency and overall medical condition he is not a candidate for aggressive evaluation such as cardiac catheterization. - Continue aspirin + clopidogrel. - Not on ACE inhibitor given renal insufficiency.  - On low dose hydralazine, add nitrates. BP in target range on balanced vasodilators - No beta blocker in the setting of acute congestive heart failure and significant AV node conduction abnormalities 2. Acute systolic congestive heart failure/signs of ischemic cardiomyopathy -  - improving renal function and reduction in edema with diuresis. Borderline hypokalemia. Holding diuresis to allow correction of hypernatremia 3. Abnormal LFTs, likely due to sepsis - improving. Recheck in 2-3 days after addition of statin. 4. History of CVA with residual Rt hemiparesis 5. Acute on chronic renal failure - improved since admission, appears to improve more after diuretics. Most recent assay prior to current illness showed creat 1.5 in 2012. 6. Diabetic ketoacidosis - resolved; not on insulin prior to admission 7. Hypernatremia - increase free water intake. Since he is now alert and can ask for water, should be easier to correct 8. Second degree AV block Mobitz 1, RBBB, LAFB -  beta blockers may not be a good idea since he appears to have advanced AV conduction disease.  9. Reported atrial fibrillation - on review of all the tracings, I do not find any evidence of this. Rhythm was often irregular due to Wenckebach cycles, especially during tachycardia.  Prognosis appears to be poor long-term. Patient is a no CODE BLUE.   Thurmon Fair, MD, [facility] [phone] office [phone] pager 10/06/2015 3:43 PM

## 2015-10-06 NOTE — Progress Notes (Addendum)
Inpatient Diabetes Program Recommendations  AACE/ADA: New Consensus Statement on Inpatient Glycemic Control (2015)  Target Ranges:  Prepandial:   less than 140 mg/dL      Peak postprandial:   less than 180 mg/dL (1-2 hours)      Critically ill patients:  140 - 180 mg/dL   Review of Glycemic Control  Diabetes history: type 2 dm Outpatient Diabetes medications: Lantus 64 units and metformin 750 mg Current orders for Inpatient glycemic control: Lantus 20 units and moderate correction tidwc  Glucose still running high in 200's - 300's. Recommend increase lantus again to 30 units and consider using sensitive correction q 4 hrs (po intake is 5% at most).  Also noted patient on metformin as op 750 mg every other day. With chronic renal failure, would recommend not using metformin at all at dishcarge.  Thank you Lenor CoffinAnn Shalae Belmonte, RN, MSN, CDE  Diabetes Inpatient Program Office: [phone] Pager: [phone] 8:00 am to 5:00 pm  I

## 2015-10-06 NOTE — Progress Notes (Signed)
Rehab Admissions Coordinator Note:  Patient was screened by Trish MageLogue, Jonesha Tsuchiya M for appropriateness for an Inpatient Acute Rehab Consult.  At this time, we are recommending Inpatient Rehab consult.  Trish MageLogue, Avantae Bither M 10/06/2015, 12:37 PM  I can be reached at [phone].

## 2015-10-07 DIAGNOSIS — I480 Paroxysmal atrial fibrillation: Secondary | ICD-10-CM

## 2015-10-07 DIAGNOSIS — G7281 Critical illness myopathy: Secondary | ICD-10-CM

## 2015-10-07 DIAGNOSIS — E870 Hyperosmolality and hypernatremia: Secondary | ICD-10-CM | POA: Insufficient documentation

## 2015-10-07 DIAGNOSIS — Z66 Do not resuscitate: Secondary | ICD-10-CM

## 2015-10-07 DIAGNOSIS — E860 Dehydration: Secondary | ICD-10-CM | POA: Insufficient documentation

## 2015-10-07 LAB — COMPREHENSIVE METABOLIC PANEL
ALBUMIN: 2.4 g/dL — AB (ref 3.5–5.0)
ALT: 141 U/L — ABNORMAL HIGH (ref 17–63)
AST: 49 U/L — ABNORMAL HIGH (ref 15–41)
Alkaline Phosphatase: 66 U/L (ref 38–126)
Anion gap: 13 (ref 5–15)
BUN: 46 mg/dL — AB (ref 6–20)
CALCIUM: 8.6 mg/dL — AB (ref 8.9–10.3)
CO2: 25 mmol/L (ref 22–32)
CREATININE: 1.34 mg/dL — AB (ref 0.61–1.24)
Chloride: 110 mmol/L (ref 101–111)
GFR, EST AFRICAN AMERICAN: 58 mL/min — AB (ref >60)
GFR, EST NON AFRICAN AMERICAN: 50 mL/min — AB (ref >60)
Glucose, Bld: 199 mg/dL — ABNORMAL HIGH (ref 65–99)
Potassium: 4.1 mmol/L (ref 3.5–5.1)
SODIUM: 148 mmol/L — AB (ref 135–145)
TOTAL PROTEIN: 5.5 g/dL — AB (ref 6.5–8.1)
Total Bilirubin: 1.2 mg/dL (ref 0.3–1.2)

## 2015-10-07 LAB — GLUCOSE, CAPILLARY
GLUCOSE-CAPILLARY: 213 mg/dL — AB (ref 65–99)
GLUCOSE-CAPILLARY: 285 mg/dL — AB (ref 65–99)
GLUCOSE-CAPILLARY: 253 mg/dL — AB (ref 65–99)
Glucose-Capillary: 186 mg/dL — ABNORMAL HIGH (ref 65–99)

## 2015-10-07 LAB — CBC
HCT: 35.9 % — ABNORMAL LOW (ref 39.0–52.0)
Hemoglobin: 11.6 g/dL — ABNORMAL LOW (ref 13.0–17.0)
MCH: 32 pg (ref 26.0–34.0)
MCHC: 32.3 g/dL (ref 30.0–36.0)
MCV: 98.9 fL (ref 78.0–100.0)
PLATELETS: 175 10*3/uL (ref 150–400)
RBC: 3.63 MIL/uL — AB (ref 4.22–5.81)
RDW: 14.1 % (ref 11.5–15.5)
WBC: 9.7 10*3/uL (ref 4.0–10.5)

## 2015-10-07 LAB — MAGNESIUM: MAGNESIUM: 2.1 mg/dL (ref 1.7–2.4)

## 2015-10-07 MED ORDER — FUROSEMIDE 80 MG PO TABS: 80.0000 mg | ORAL_TABLET | Freq: Two times a day (BID) | ORAL | Status: DC

## 2015-10-07 MED ORDER — INSULIN GLARGINE 100 UNIT/ML ~~LOC~~ SOLN
5.0000 [IU] | Freq: Once | SUBCUTANEOUS | Status: AC
  Administered 2015-10-07: 5 [IU] via SUBCUTANEOUS
  Filled 2015-10-07: qty 0.05

## 2015-10-07 MED ORDER — ACETAMINOPHEN 325 MG PO TABS
650.0000 mg | ORAL_TABLET | Freq: Four times a day (QID) | ORAL | Status: DC | PRN
  Administered 2015-10-07 – 2015-10-09 (×2): 650 mg via ORAL
  Filled 2015-10-07 (×2): qty 2

## 2015-10-07 MED ORDER — AMOXICILLIN-POT CLAVULANATE 875-125 MG PO TABS
1.0000 | ORAL_TABLET | Freq: Two times a day (BID) | ORAL | Status: DC
  Administered 2015-10-07 – 2015-10-09 (×5): 1 via ORAL
  Filled 2015-10-07 (×6): qty 1

## 2015-10-07 MED ORDER — TRAMADOL HCL 50 MG PO TABS: 50.0000 mg | ORAL_TABLET | Freq: Four times a day (QID) | ORAL | Status: DC | PRN

## 2015-10-07 MED ORDER — INSULIN GLARGINE 100 UNIT/ML ~~LOC~~ SOLN
30.0000 [IU] | Freq: Every day | SUBCUTANEOUS | Status: DC
  Administered 2015-10-08 – 2015-10-09 (×2): 30 [IU] via SUBCUTANEOUS
  Filled 2015-10-07 (×2): qty 0.3

## 2015-10-07 MED ORDER — TAMSULOSIN HCL 0.4 MG PO CAPS
0.4000 mg | ORAL_CAPSULE | Freq: Two times a day (BID) | ORAL | Status: DC
  Administered 2015-10-07 – 2015-10-09 (×4): 0.4 mg via ORAL
  Filled 2015-10-07 (×4): qty 1

## 2015-10-07 NOTE — Progress Notes (Signed)
Unable to get ahold of pts wife to make aware of pts transfer, tiffany pts 3E nurse made aware.

## 2015-10-07 NOTE — Evaluation (Signed)
Occupational Therapy Evaluation Patient Details Name: [name] MRN: [id number] DOB: [birth year] Today's Date: 10/07/2015    History of Present Illness This is a 75 yo white male with a h/o Type 2 DM and CVA With residual right-sided weakness who presents via EMS with SOB, syncope and bradycardia had CBG's in 500's and positive for NSTEMI.   Clinical Impression   PT admitted with Nstemi. Pt currently with functional limitiations due to the deficits listed below (see OT problem list). PTA living at home with wife (A). Pt will benefit from skilled OT to increase their independence and safety with adls and balance to allow discharge SNF. Pt unable to tolerate OT evaluation at this time due to lethargic but question hadol medication provided at 9:20 AM. Pt was calling out [facility] HELP prior to OT arrival. Pt now with only aroused attention.      Follow Up Recommendations  SNF;Supervision/Assistance - 24 hour    Equipment Recommendations  Hospital bed;Wheelchair cushion (measurements OT);Wheelchair (measurements OT);Other (comment) (mattress overlay)    Recommendations for Other Services       Precautions / Restrictions Precautions Precautions: Fall Precaution Comments: R hemiplegia      Mobility Bed Mobility Overal bed mobility: Needs Assistance Bed Mobility: Rolling Rolling: Total assist         General bed mobility comments: pt holding rail once in side lying total (A) to reach position. Pt with pillows placed for position change and tech April notified  Transfers                 General transfer comment: not appropriate at [facility] stime. HOB elevated and decreased attempting to incr arousal for transfer to EOB. Pt with aroused only attention level    Balance                                            ADL Overall ADL's : Needs assistance/impaired                                       General ADL Comments: total (A) for all  adls. pt asked to open eyes and show two fingers. pt with good return demonstration but only able to sustani attention for this task. pt opening eyes half mask to command. Pt required total (A) to log roll R for pillow placement for pressure relief  pt with same response to name call and good afternoon. Pt currently very lethargic limtting session     Vision Additional Comments: to be further assessed   Perception     Praxis      Pertinent Vitals/Pain Pain Assessment: Faces Faces Pain Scale: No hurt     Hand Dominance  (unknown)   Extremity/Trunk Assessment Upper Extremity Assessment Upper Extremity Assessment: RUE deficits/detail RUE Deficits / Details: spastic with PROM and tremor noted in the hand. Pt with resistance to PROM   Lower Extremity Assessment Lower Extremity Assessment: Defer to PT evaluation   Cervical / Trunk Assessment Cervical / Trunk Assessment: Kyphotic   Communication Communication Communication: Other (comment) (no verbalizations)   Cognition Arousal/Alertness: Lethargic;Suspect due to medications (hadol provided at 9:20am) Behavior During Therapy: Flat affect Overall Cognitive Status: No family/caregiver present to determine baseline cognitive functioning  General Comments       Exercises       Shoulder Instructions      Home Living Family/patient expects to be discharged to:: Skilled nursing facility                                        Prior Functioning/Environment Level of Independence: Needs assistance        Comments: pt too lethargic to verbalize but per PT notes wife (A)ing    OT Diagnosis: Generalized weakness;Cognitive deficits;Disturbance of vision   OT Problem List: Decreased strength;Decreased activity tolerance;Impaired balance (sitting and/or standing);Decreased cognition;Decreased coordination;Impaired vision/perception;Decreased range of motion;Decreased safety  awareness;Decreased knowledge of use of DME or AE;Decreased knowledge of precautions;Cardiopulmonary status limiting activity;Impaired sensation;Obesity;Impaired UE functional use;Pain;Increased edema   OT Treatment/Interventions: Self-care/ADL training;Therapeutic exercise;Neuromuscular education;DME and/or AE instruction;Therapeutic activities;Cognitive remediation/compensation;Visual/perceptual remediation/compensation;Patient/family education;Balance training    OT Goals(Current goals can be found in the care plan section) Acute Rehab OT Goals Patient Stated Goal: none stated OT Goal Formulation: Patient unable to participate in goal setting Time For Goal Achievement: 10/21/15 Potential to Achieve Goals: Fair  OT Frequency: Min 2X/week   Barriers to D/C:            Co-evaluation              End of Session Nurse Communication: Mobility status;Precautions  Activity Tolerance: Patient limited by lethargy Patient left: in bed;with call bell/phone within reach   Time: 1200-1211 OT Time Calculation (min): 11 min Charges:  OT General Charges $OT Visit: 1 Procedure OT Evaluation $OT Eval High Complexity: 1 Procedure G-Codes:    Harolyn RutherfordJones, Lamon Rotundo B 10/07/2015, 12:21 PM  Mateo FlowJones, Brynn   OTR/L Pager: [phone] Office: [phone] .

## 2015-10-07 NOTE — Progress Notes (Signed)
[facility] PROGRESS NOTE  [name] KGM:[id number] DOB: June 09, 1941 DOA: 10/23/15 PCP: [name], MD  Interim Summary 75 year old male with a history of diabetes mellitus type 2, stroke with residual right hemiparesis presented on 10-23-2015 with shortness of breath, bradycardia and syncope. The patient had a history of diarrhea abdominal pain and generalized malaise for several days prior to admission. CT abdomen and pelvis at the time of admission showed mild sigmoid thickening thought to be due to muscular hypertrophy without any other acute findings. CT of the chest that time showed bilateral pleural effusions. The patient was given atropine 1 mg with improvement of his heart rate. He was noted to be hypotensive and severely hyperglycemic in the emergency department. In addition, his troponins were elevated with EKG changes. The patient responded to fluid resuscitation. Cardiology was consulted for elevated troponins.   Assessment/Plan: #1 acute respiratory failure with hypoxia Likely multifactorial secondary to acute CHF exacerbation/pulmonary edema and pleural effusions in the setting of probable aspiration pneumonia. Patient on 2 L nasal cannula with sats in the 99. Patient with some coarse rhonchorous breath sounds likely due to aspiration. On 10/04/2015 due to concerns that patient was aspirating given his mental status chest x-ray clinical exam patient was started on IV Unasyn. Chest x-ray from 10/03/2015 consistent with pulmonary edema right lower lobe increased opacity. Patient has been seen by speech therapy and currently on a dysphagia diet. Continue diuretics. Change IV Unasyn tom oral augmentin. Follow.  #2 acute systolic heart failure/ischemic cardiomyopathy Questionable etiology. May be secondary to non-STEMI. 2-D echo from 09/29/2015 with a EF of 20-25% with inferolateral akinesia. Patient still with some volume overload on clinical exam. Patient initially  could not be started on ACE inhibitor secondary to renal failure. Renal function improving and maybe ACE could be started, however will defer to cardiology. Patient is -1.6 L during this hospitalization. Current weight is 114.1kg from 117.8 kg from 120.9 kg on 10/04/2015. Continue aspirin, Plavix,  hydralazine, Isordil. Hold oral diuretics today secondary to hypernatremia. Per cardiology.  #3 probable aspiration pneumonia Patient with probable aspiration per modified barium swallow. Patient would less rhonchorous breath sounds today. Chest x-ray from 10/03/2015 with increased right lower lobe opacity. Patient currently afebrile. WBC trending down. Continue Mucinex. Flutter valve. Chest PT.Change IV Unasyn to oral augmentin.  #4 DKA in the setting of type 2 diabetes   hemoglobin A1c 7.4. Patient initially presented in diabetic ketoacidosis with CBGs of 586 and anion gap of 18. Patient was placed on the glucose stabilizer and subsequently transitioned to subcutaneous insulin. CBGs have ranged from 213-316. D5W also likely playing a role.  Increase Lantus to 30 units. Continue sliding scale insulin. Continue meal coverage insulin. Follow and titrate as needed.  #5 non-STEMI Patient noted to have a troponin peaking at 30.52. 2-D echo had a EF of 20-25% with diffuse hypokinesis and akinesis of the inferior lateral myocardium with moderate mitral valve regurgitation. Patient was initially started on a heparin drip. Cardiology consulted and currently following. Patient deemed not a candidate for heart catheterization secondary to his comorbidities including previous CVA, renal insufficiency, overall medical condition. Unable to place on ACE inhibitor secondary to renal insufficiency initially, however with improvement of renal function may possibly be started by cardiology. Unable to place on statin secondary to transaminitis. LFTs trending down. Beta blocker on hold for now per cardiology in the setting of acute  CHF exacerbation and patient's significant AV node conduction abnormalities. Continue current medical regimen of aspirin, Plavix,  diuretics, hydralazine, isordil. Per cardiology.  #6 transaminitis Likely to shock liver/sepsis. Right upper quadrant ultrasound with fatty liver and negative for cholecystitis. Improving.  #7 history of CVA with residual right hemiparesis Continue aspirin and Plavix for secondary stroke prevention. PT/OT.  #8 hypernatremia Continue D5W at 50 mL per hour and follow. Hold lasix today. Continue free water.  #9 second-degree AV block Mobitz 1/RBBB/LAFB Per cardiology.  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 Baseline creatinine 1.1-1.4. Renal function slowly trending down and at baseline. Creatinine peaked at 3.89. Current creatinine currently at 1.34. Hold diuretics today secondary to hypernatremia.   #11 dysphagia Patient underwent modified barium swallow 10/01/2015. Patient currently on a dysphagia 1 diet with nectar thick liquids. Continue aspiration precautions.  #12 acute metabolic encephalopathy Clinical improvement, and likely close to baseline. CT head negative for any acute cortical abnormalities, moderate cerebral atrophy with progression from prior exam. EEG which was done showed generalized slowing indicating mild-to-moderate cerebral disturbance. No epileptiform activity noted. Patient alert and following some simple commands. Follow.  #13 prophylaxis Heparin for DVT prophylaxis.   Code Status: DO NOT RESUSCITATE Family Communication: Updated patient. No family at bedside. Disposition Plan: Transfer to telemetry. Dispo pending palliative eval. Prob SNF    Consultants:  Cardiology: Dr. Elease Hashimoto 10/10/2015  Procedures:  CT head 09/28/2015  CT chest 10/05/2015  CT abdomen and  pelvis 10/05/2015  Chest x-ray 10/23/2015, 09/29/2015, 1 60,017, 10/01/2015, 10/03/2015  Modified barium swallow 10/01/2015  2-D echo 09/29/2015   Lower extremity Dopplers 10/03/2015  Right upper quadrant ultrasound 09/29/2015   EEG 09/28/2015  Antibiotics:  IV vancomycin 09/29/2015>>>> 10/01/2015             IV Zosyn 10/06/2015>>>> 10/02/2015  IV Unasyn 10/04/2015>>>>>10/07/2015             Oral augmentin 10/07/2015  HPI/Subjective: Patient alert. Following simple commands. Some dysarthric speech. Denies CP. Denies SOB.  Objective: Filed Vitals:   10/07/15 0500 10/07/15 0731  BP:  133/59  Pulse: 79 78  Temp:  97 F (36.1 C)  Resp: 19 21    Intake/Output Summary (Last 24 hours) at 10/07/15 0932 Last data filed at 10/07/15 0803  Gross per 24 hour  Intake   2770 ml  Output   1575 ml  Net   1195 ml   Filed Weights   10/05/15 0552 10/06/15 0300 10/07/15 0400  Weight: 117.8 kg (259 lb 11.2 oz) 114.1 kg (251 lb 8.7 oz) 114 kg (251 lb 5.2 oz)    Exam:   General:  Laying in bed  Cardiovascular: RRR  Respiratory: CTAB anterior lung fields.  Abdomen: Soft/NT/ND/+BS  Musculoskeletal: No c/c/ trace edema  Data Reviewed: Basic Metabolic Panel:  Recent Labs Lab 10/01/15 0601  10/03/15 0310 10/04/15 0555 10/05/15 0251 10/06/15 0320 10/07/15 0340  NA 144  < > 146* 145 148* 151* 148*  K 3.5  < > 3.5 3.5 3.1* 3.5 4.1  CL 110  < > 110 108 111 111 110  CO2 21*  < > 22 22 25 27 25   GLUCOSE 187*  < > 248* 290* 293* 224* 199*  BUN 65*  < > 73* 76* 64* 57* 46*  CREATININE 2.26*  < > 2.47* 2.12* 1.86* 1.69* 1.34*  CALCIUM 8.5*  < > 8.8* 8.5* 8.6* 8.6* 8.6*  MG 1.9  --   --   --  2.3 2.0 2.1  PHOS 4.0  --   --   --   --   --   --   < > = values in this interval not displayed. Liver Function Tests:  Recent Labs Lab 10/03/15 0310 10/04/15 0555 10/05/15 0251 10/07/15 0340  AST 115* 56* 55* 49*  ALT 370* 265* 223* 141*  ALKPHOS 61 62 66 66  BILITOT 1.4*  1.4* 1.5* 1.2  PROT 6.2* 6.1* 6.3* 5.5*  ALBUMIN 2.8* 2.7* 2.6* 2.4*   No results for input(s): LIPASE, AMYLASE in the last 168 hours.  Recent Labs Lab 09/30/15 1020  AMMONIA 33   CBC:  Recent Labs Lab 10/03/15 0310 10/04/15 0555 10/05/15 0251 10/06/15 0320 10/07/15 0530  WBC 12.3* 11.5* 10.2 10.0 9.7  HGB 10.2* 10.4* 11.0* 11.6* 11.6*  HCT 31.0* 32.2* 34.2* 37.0* 35.9*  MCV 96.9 100.0 100.0 99.5 98.9  PLT 150 159 165 170 175   Cardiac Enzymes:  Recent Labs Lab 09/30/15 1020 09/30/15 1625 09/30/15 2059  TROPONINI 26.19* 19.73* 18.77*   BNP (last 3 results)  Recent Labs  10/03/15 0310  BNP 2538.6*    ProBNP (last 3 results) No results for input(s): PROBNP in the last 8760 hours.  CBG:  Recent Labs Lab 10/06/15 0729 10/06/15 1132 10/06/15 1619 10/06/15 2108 10/07/15 0801  GLUCAP 229* 352* 316* 273* 213*    Recent Results (from the past 240 hour(s))  Blood Culture (routine x 2)     Status: None   Collection Time: 08-28-2016  4:42 PM  Result Value Ref Range Status   Specimen Description BLOOD LEFT HAND  Final   Special Requests IN PEDIATRIC BOTTLE 1CC  Final   Culture NO GROWTH 5 DAYS  Final   Report Status 10/02/2015 FINAL  Final  Blood Culture (routine x 2)     Status: None   Collection Time: 08-28-2016  4:58 PM  Result Value Ref Range Status   Specimen Description BLOOD LEFT FOREARM  Final   Special Requests BOTTLES DRAWN AEROBIC AND ANAEROBIC 5CC  Final   Culture  Setup Time   Final    GRAM POSITIVE COCCI IN CLUSTERS ANAEROBIC BOTTLE ONLY CRITICAL RESULT CALLED TO, READ BACK BY AND VERIFIED WITH: Candy Sledge SMITH RN 1530 09/28/15 A BROWNING    Culture   Final    STAPHYLOCOCCUS SPECIES (COAGULASE NEGATIVE) THE SIGNIFICANCE OF ISOLATING THIS ORGANISM FROM A SINGLE SET OF BLOOD CULTURES WHEN MULTIPLE SETS ARE DRAWN IS UNCERTAIN. PLEASE NOTIFY THE MICROBIOLOGY DEPARTMENT WITHIN ONE WEEK IF SPECIATION AND SENSITIVITIES ARE REQUIRED.    Report Status  09/30/2015 FINAL  Final  MRSA PCR Screening     Status: None   Collection Time: 08-28-2016  6:28 PM  Result Value Ref Range Status   MRSA by PCR NEGATIVE NEGATIVE Final    Comment:        The GeneXpert MRSA Assay (FDA approved for NASAL specimens only), is one component of a comprehensive MRSA colonization surveillance program. It is not intended to diagnose MRSA infection nor to guide or monitor treatment for MRSA infections.   Urine culture     Status: None   Collection Time: 09/28/15  7:16 PM  Result Value Ref Range Status   Specimen Description URINE, CLEAN CATCH  Final   Special Requests NONE  Final   Culture NO GROWTH 2 DAYS  Final   Report Status 09/30/2015 FINAL  Final  Culture, blood (Routine X 2) w Reflex to ID Panel  Status: None   Collection Time: 09/30/15 10:20 AM  Result Value Ref Range Status   Specimen Description BLOOD LEFT HAND  Final   Special Requests IN PEDIATRIC BOTTLE 2CC  Final   Culture NO GROWTH 5 DAYS  Final   Report Status 10/05/2015 FINAL  Final  Culture, blood (Routine X 2) w Reflex to ID Panel     Status: None   Collection Time: 09/30/15 10:27 AM  Result Value Ref Range Status   Specimen Description BLOOD LEFT HAND  Final   Special Requests IN PEDIATRIC BOTTLE 3CC  Final   Culture NO GROWTH 5 DAYS  Final   Report Status 10/05/2015 FINAL  Final  Urine culture     Status: None   Collection Time: 10/03/15  1:39 PM  Result Value Ref Range Status   Specimen Description URINE, CLEAN CATCH  Final   Special Requests NONE  Final   Culture NO GROWTH 1 DAY  Final   Report Status 10/04/2015 FINAL  Final  C difficile quick scan w PCR reflex     Status: None   Collection Time: 10/05/15 10:30 PM  Result Value Ref Range Status   C Diff antigen NEGATIVE NEGATIVE Final   C Diff toxin NEGATIVE NEGATIVE Final   C Diff interpretation Negative for toxigenic C. difficile  Final     Studies: No results found.  Scheduled Meds: . ampicillin-sulbactam  (UNASYN) IV  1.5 g Intravenous Q6H  . antiseptic oral rinse  7 mL Mouth Rinse BID  . aspirin EC  81 mg Oral Daily  . atorvastatin  10 mg Oral q1800  . clopidogrel  75 mg Oral Daily  . dextrose  1 ampule Intravenous Once  . free water  250 mL Oral Q6H  . [START ON 10/08/2015] furosemide  80 mg Oral BID  . guaiFENesin  1,200 mg Oral BID  . heparin subcutaneous  5,000 Units Subcutaneous 3 times per day  . hydrALAZINE  10 mg Oral 3 times per day  . insulin aspart  0-15 Units Subcutaneous TID WC  . insulin glargine  25 Units Subcutaneous Daily  . isosorbide dinitrate  10 mg Oral TID  . potassium chloride  30 mEq Oral BID   Continuous Infusions: . dextrose 50 mL/hr at 10/06/15 2352    Principal Problem:   Acute respiratory failure with hypoxia (HCC) Active Problems:   NSTEMI (non-ST elevated myocardial infarction) (HCC)   Acute systolic CHF (congestive heart failure) (HCC)   DKA (diabetic ketoacidoses) (HCC)   Acute respiratory failure (HCC)   Paroxysmal atrial fibrillation (HCC)   Dysphagia   Acute renal failure superimposed on stage 3 chronic kidney disease (HCC)   Elevated transaminase level   Wenckebach second degree AV block   Aspiration pneumonia (HCC)   Cardiomyopathy, ischemic   Hypotension   Encephalopathy, metabolic   Palliative care encounter   Hyperosmolality and/or hypernatremia    Time spent: 40 mins    [facility] MD [facility] Pager [phone]. If 7PM-7AM, please contact night-coverage at [email or web address], password [facility] 10/07/2015, 9:32 AM  LOS: 10 days

## 2015-10-07 NOTE — Progress Notes (Signed)
I met with pt at bedside to begin discussions of rehab venue options. Pt lethargic. I left a message for pt's wife to contact me to discuss preference. Pt currently not at a level to be able to do intense therapy for inpt rehab. I will follow up on Monday. [phone]

## 2015-10-07 NOTE — Progress Notes (Signed)
Pt has refused CPT vest therapy.

## 2015-10-07 NOTE — Progress Notes (Signed)
MD made aware that pt is yelling out "Help", when asking pt he states that is in in generalized pain but mainly his arms. Will monitor.

## 2015-10-07 NOTE — Progress Notes (Signed)
Patient Name: [name] Date of Encounter: 10/07/2015  Principal Problem:   Acute respiratory failure with hypoxia (HCC) Active Problems:   DKA (diabetic ketoacidoses) (HCC)   Acute respiratory failure (HCC)   NSTEMI (non-ST elevated myocardial infarction) (HCC)   Acute systolic CHF (congestive heart failure) (HCC)   Paroxysmal atrial fibrillation (HCC)   Dysphagia   Acute renal failure superimposed on stage 3 chronic kidney disease (HCC)   Elevated transaminase level   Wenckebach second degree AV block   Aspiration pneumonia (HCC)   Cardiomyopathy, ischemic   Hypotension   Encephalopathy, metabolic   Palliative care encounter   Hyperosmolality and/or hypernatremia   Length of Stay: 10  SUBJECTIVE  More alert, occasionally restless. Talking in complete sentences, complains of back pain.  CURRENT MEDS . ampicillin-sulbactam (UNASYN) IV  1.5 g Intravenous Q6H  . antiseptic oral rinse  7 mL Mouth Rinse BID  . aspirin EC  81 mg Oral Daily  . atorvastatin  10 mg Oral q1800  . clopidogrel  75 mg Oral Daily  . dextrose  1 ampule Intravenous Once  . free water  250 mL Oral Q6H  . [START ON 10/08/2015] furosemide  80 mg Oral BID  . guaiFENesin  1,200 mg Oral BID  . heparin subcutaneous  5,000 Units Subcutaneous 3 times per day  . hydrALAZINE  10 mg Oral 3 times per day  . insulin aspart  0-15 Units Subcutaneous TID WC  . insulin glargine  25 Units Subcutaneous Daily  . isosorbide dinitrate  10 mg Oral TID  . potassium chloride  30 mEq Oral BID    OBJECTIVE   Intake/Output Summary (Last 24 hours) at 10/07/15 0925 Last data filed at 10/07/15 0803  Gross per 24 hour  Intake   2770 ml  Output   1575 ml  Net   1195 ml   Filed Weights   10/05/15 0552 10/06/15 0300 10/07/15 0400  Weight: 259 lb 11.2 oz (117.8 kg) 251 lb 8.7 oz (114.1 kg) 251 lb 5.2 oz (114 kg)    PHYSICAL EXAM Filed Vitals:   10/07/15 0330 10/07/15 0400 10/07/15 0500 10/07/15 0731  BP: 133/66    133/59  Pulse: 76  79 78  Temp:    97 F (36.1 C)  TempSrc:    Oral  Resp: 18  19 21   Height:      Weight:  251 lb 5.2 oz (114 kg)    SpO2: 100%  96% 95%   General: Alert, oriented x 2, no distress Head: no evidence of trauma, PERRL, EOMI, no exophtalmos or lid lag, no myxedema, no xanthelasma; normal ears, nose and oropharynx, dry mucous membranes Neck: normal jugular venous pulsations and no hepatojugular reflux; brisk carotid pulses without delay and no carotid bruits Chest: clear to auscultation, no signs of consolidation by percussion or palpation, normal fremitus, symmetrical and full respiratory excursions Cardiovascular: normal position and quality of the apical impulse, regular rhythm, normal first and second heart sounds, no rubs or gallops, no murmur Abdomen: no tenderness or distention, no masses by palpation, no abnormal pulsatility or arterial bruits, normal bowel sounds, no hepatosplenomegaly Extremities: no clubbing, cyanosis or edema; 2+ radial, ulnar and brachial pulses bilaterally;cannot locate pedal pulses Neurological: right hemiparesis  LABS  CBC  Recent Labs  10/06/15 0320 10/07/15 0530  WBC 10.0 9.7  HGB 11.6* 11.6*  HCT 37.0* 35.9*  MCV 99.5 98.9  PLT 170 175   Basic Metabolic Panel  Recent Labs  10/06/15 0320  10/07/15 0340  NA 151* 148*  K 3.5 4.1  CL 111 110  CO2 27 25  GLUCOSE 224* 199*  BUN 57* 46*  CREATININE 1.69* 1.34*  CALCIUM 8.6* 8.6*  MG 2.0 2.1   Liver Function Tests  Recent Labs  10/05/15 0251 10/07/15 0340  AST 55* 49*  ALT 223* 141*  ALKPHOS 66 66  BILITOT 1.5* 1.2  PROT 6.3* 5.5*  ALBUMIN 2.6* 2.4*    Radiology Studies Imaging results have been reviewed and No results found.  TELE NSR   ASSESSMENT AND PLAN  1. Non-ST elevation myocardial infarction- Echocardiogram showed severely reduced LV function. However given his previous CVA, renal insufficiency and overall medical condition he is not a candidate  for aggressive evaluation such as cardiac catheterization. - Continue aspirin + clopidogrel. - Not on ACE inhibitor given renal insufficiency. This becomes an option now that creatinine is lower - On low dose hydralazine, add nitrates. BP in target range on balanced vasodilators - No beta blocker in the setting of acute congestive heart failure and significant AV node conduction abnormalities 2. Acute systolic congestive heart failure/signs of ischemic cardiomyopathy  - improving renal function and reduction in edema with diuresis.Holding diuresis to allow correction of hypernatremia. 3. Abnormal LFTs, likely due to sepsis - improving. Recheck in 2-3 days after addition of statin. 4. History of CVA with residual Rt hemiparesis 5. Acute on chronic renal failure - improved since admission, appears to improve more after diuretics. Most recent assay prior to current illness showed creat 1.5 in 2012. 6. Diabetic ketoacidosis - resolved; not on insulin prior to admission 7. Hypernatremia - increase free water intake. Since he is now alert and can ask for water, should be easier to correct 8. Second degree AV block Mobitz 1, RBBB, LAFB - beta blockers may not be a good idea since he appears to have advanced AV conduction disease.  9. Reported atrial fibrillation - on review of all the tracings, I do not find any evidence of this. Rhythm was often irregular due to Wenckebach cycles, especially during tachycardia.  Once NA level normalizes, will start a scheduled oral dose of diuretic and also start ACEi if renal function remains stable.  Prognosis appears to be poor long-term. Patient is a no CODE BLUE.   Thurmon FairMihai Kharisma Glasner, MD, [facility] [phone] office [phone] pager 10/07/2015 9:25 AM

## 2015-10-07 NOTE — Clinical Documentation Improvement (Signed)
Internal Medicine  Please clarify if the following diagnosis, Sepsis ruled in as:   Present at the time of admission (POA)  NOT present at the time of admission and it developed during the inpatient stay  Unable to clinically determine whether the condition was present on admission.  Unknown   Supporting Information:  H + P r/o sepsis  Please exercise your independent, professional judgment when responding. A specific answer is not anticipated or expected.   Thank You,  Lavonda JumboLawanda J Adhira Jamil [facility] [employer] [facility] [place] [phone]

## 2015-10-07 NOTE — Consult Note (Signed)
Physical Medicine and Rehabilitation Consult   Reason for Consult: Debility Referring Physician: Dr. Janee Morn   HPI: [name] is a 75 y.o. male with history of DM type 2, CKD, CVA with residual R-HP who was admitted on 10/17/2015 with generalized malaise, N/V, SOB, bradycardia and syncope.  He was noted to be lethargic with DKA, hyperkalemia, hypotensive and had elevated troponins. He was treated with IVF, IV insulin and IV antibiotics due to concerns of sepsis. Metabolic acidosis resolved and EEG with generalized slowing due to mild to moderate encephalopathy. Elevated troponins felt to be due to demand ischemia and IV heparin added X 48 hours. 2 D echo with Ef 20-25%, akinesis of inferolateral myocardium and moderate MVR.  BLE dopplers negative for DVT. Patient has had slow improvement in mentation and not felt to be a candidate for aggressive cardiac evaluation.  Acute on chronic systolic CHF resolving and to be treated medically as he appears to have poor long term prognosis per Dr. Royann Shivers. Swallow evaluation done yesterday and patient required maximal verbal, visual and tactile cues to initiate swallow. Dysphagia 1, nectar liquids recommended with staff assistance. PT evaluation done yesterday and CIR recommended for follow up therapy.    Review of Systems  Unable to perform ROS: mental acuity  Eyes: Negative for blurred vision.  Respiratory: Positive for shortness of breath.   Cardiovascular: Negative for chest pain and palpitations.      Past Medical History  Diagnosis Date  . Diabetes mellitus without complication (HCC)   . Stroke [facility])     No past surgical history on file.    No family history on file.    Social History:  Married. Reports was limited in mobility--able to transfer with walker but limited to room/used [facility]? Needed assistance for mobility? Marland Kitchen  Retired Acupuncturist. Per reports  he has never smoked. He does not have any smokeless tobacco  history on file. Per reports he does not drink alcohol. His drug history is not on file.    Allergies: No Known Allergies    Medications Prior to Admission  Medication Sig Dispense Refill  . glimepiride (AMARYL) 4 MG tablet Take 4 mg by mouth daily with breakfast.    . insulin glargine (LANTUS) 100 UNIT/ML injection Inject 64 Units into the skin daily.    Marland Kitchen lisinopril-hydrochlorothiazide (PRINZIDE,ZESTORETIC) 20-12.5 MG tablet Take 1 tablet by mouth daily.    . metFORMIN (GLUCOPHAGE-XR) 750 MG 24 hr tablet Take 750 mg by mouth every other day.    . tamsulosin (FLOMAX) 0.4 MG CAPS capsule Take 0.4 mg by mouth 2 (two) times daily.    Marland Kitchen terbinafine (LAMISIL) 250 MG tablet Take 250 mg by mouth daily.      Home: Home Living Family/patient expects to be discharged to:: Skilled nursing facility Living Arrangements: Spouse/significant other Available Help at Discharge: Family Type of Home: House Home Access: Ramped entrance Home Layout: One level Home Equipment: Cane - quad  Functional History: Prior Function Level of Independence: Needs assistance Gait / Transfers Assistance Needed: reports wife helped him walke with quad cane ADL's / Homemaking Assistance Needed: states wife helped him dress Functional Status:  Mobility: Bed Mobility Overal bed mobility: Needs Assistance Bed Mobility: Rolling, Sidelying to Sit, Sit to Supine Rolling: Mod assist Sidelying to sit: Max assist Sit to supine: +2 for physical assistance, Total assist General bed mobility comments: cues and assist to reach for rail, assist to bring hips over with pad and for  feet off bed, assist to lift trunk; RN assisted with return to supine for trunk and I assisted with legs, positioned on R side with pillows for comfort Transfers Overall transfer level: Needs assistance Transfers: Lateral/Scoot Transfers  Lateral/Scoot Transfers: Max assist General transfer comment: unable to laterally scoot to head of bed with max  A (unable to clear hips with scoot/squat pivot)      ADL:    Cognition: Cognition Overall Cognitive Status: No family/caregiver present to determine baseline cognitive functioning Orientation Level: Oriented to person, Oriented to place, Oriented to time, Disoriented to situation Cognition Arousal/Alertness: Awake/alert Behavior During Therapy: Flat affect Overall Cognitive Status: No family/caregiver present to determine baseline cognitive functioning  Blood pressure 133/59, pulse 78, temperature 97 F (36.1 C), temperature source Oral, resp. rate 21, height 6\' 1"  (1.854 m), weight 114 kg (251 lb 5.2 oz), SpO2 95 %. Physical Exam  Nursing note and vitals reviewed. Constitutional: He appears well-developed and well-nourished. He appears lethargic. He has a sickly appearance.  Pale, lethargic male who was unable to stay awake for exam.  He was calling out for help and repositioning but unable to tolerate HOB <30 degrees.   HENT:  Head: Normocephalic and atraumatic.  Edentulous.   Neck: Neck supple.  Cardiovascular: Normal rate and regular rhythm.   Respiratory: Effort normal. No respiratory distress. He has decreased breath sounds in the right middle field, the right lower field, the left middle field and the left lower field. He has no wheezes.  GI: Soft. Bowel sounds are normal. He exhibits no distension. There is no tenderness.  Musculoskeletal:  2 + pitting edema BLE and RUE> LUE.   Neurological: He appears lethargic.  Oriented to self and place.  Right facial weakness with dysarthric speech. Right flaccid hemiparesis. Able to move LUE and LLE with verbal and tactile cues. Strength grossly 1+ shoulder girdle and hip girdle and 2/5 distally. Decreased sensation to LT distally in feet/legs  Skin: Skin is warm. Bruising (bilateral knees.) noted. There is pallor.  Psychiatric: His affect is blunt. His speech is delayed. He is slowed. Cognition and memory are impaired.    Results  for orders placed or performed during the hospital encounter of 10/06/2015 (from the past 24 hour(s))  Glucose, capillary     Status: Abnormal   Collection Time: 10/06/15 11:32 AM  Result Value Ref Range   Glucose-Capillary 352 (H) 65 - 99 mg/dL   Comment 1 Capillary Specimen   Glucose, capillary     Status: Abnormal   Collection Time: 10/06/15  4:19 PM  Result Value Ref Range   Glucose-Capillary 316 (H) 65 - 99 mg/dL   Comment 1 Capillary Specimen   Glucose, capillary     Status: Abnormal   Collection Time: 10/06/15  9:08 PM  Result Value Ref Range   Glucose-Capillary 273 (H) 65 - 99 mg/dL   Comment 1 Capillary Specimen   Comprehensive metabolic panel     Status: Abnormal   Collection Time: 10/07/15  3:40 AM  Result Value Ref Range   Sodium 148 (H) 135 - 145 mmol/L   Potassium 4.1 3.5 - 5.1 mmol/L   Chloride 110 101 - 111 mmol/L   CO2 25 22 - 32 mmol/L   Glucose, Bld 199 (H) 65 - 99 mg/dL   BUN 46 (H) 6 - 20 mg/dL   Creatinine, Ser 1.611.34 (H) 0.61 - 1.24 mg/dL   Calcium 8.6 (L) 8.9 - 10.3 mg/dL   Total Protein 5.5 (L) 6.5 -  8.1 g/dL   Albumin 2.4 (L) 3.5 - 5.0 g/dL   AST 49 (H) 15 - 41 U/L   ALT 141 (H) 17 - 63 U/L   Alkaline Phosphatase 66 38 - 126 U/L   Total Bilirubin 1.2 0.3 - 1.2 mg/dL   GFR calc non Af Amer 50 (L) >60 mL/min   GFR calc Af Amer 58 (L) >60 mL/min   Anion gap 13 5 - 15  Magnesium     Status: None   Collection Time: 10/07/15  3:40 AM  Result Value Ref Range   Magnesium 2.1 1.7 - 2.4 mg/dL  CBC     Status: Abnormal   Collection Time: 10/07/15  5:30 AM  Result Value Ref Range   WBC 9.7 4.0 - 10.5 K/uL   RBC 3.63 (L) 4.22 - 5.81 MIL/uL   Hemoglobin 11.6 (L) 13.0 - 17.0 g/dL   HCT 16.1 (L) 09.6 - 04.5 %   MCV 98.9 78.0 - 100.0 fL   MCH 32.0 26.0 - 34.0 pg   MCHC 32.3 30.0 - 36.0 g/dL   RDW 40.9 81.1 - 91.4 %   Platelets 175 150 - 400 K/uL   No results found.  Assessment/Plan: Diagnosis: critical illness myopathy, encephalopthy 1. Does the need for  close, 24 hr/day medical supervision in concert with the patient's rehab needs make it unreasonable for this patient to be served in a less intensive setting? Yes 2. Co-Morbidities requiring supervision/potential complications: chf/peripheral edema, acute respiratory failure, dm, paf 3. Due to bladder management, bowel management, safety, skin/wound care, disease management, medication administration, pain management and patient education, does the patient require 24 hr/day rehab nursing? Yes 4. Does the patient require coordinated care of a physician, rehab nurse, PT (1-2 hrs/day, 5 days/week), OT (1-2 hrs/day, 5 days/week) and SLP (1-2 hrs/day, 5 days/week) to address physical and functional deficits in the context of the above medical diagnosis(es)? Yes Addressing deficits in the following areas: balance, endurance, locomotion, strength, transferring, bowel/bladder control, bathing, dressing, feeding, grooming, toileting, cognition, speech, swallowing and psychosocial support 5. Can the patient actively participate in an intensive therapy program of at least 3 hrs of therapy per day at least 5 days per week? Potentially 6. The potential for patient to make measurable gains while on inpatient rehab is excellent 7. Anticipated functional outcomes upon discharge from inpatient rehab are supervision and min assist  with PT, supervision, min assist and mod assist with OT, supervision with SLP. 8. Estimated rehab length of stay to reach the above functional goals is: 18-22 days 9. Does the patient have adequate social supports and living environment to accommodate these discharge functional goals? Yes 10. Anticipated D/C setting: Home 11. Anticipated post D/C treatments: HH therapy 12. Overall Rehab/Functional Prognosis: good  RECOMMENDATIONS: This patient's condition is appropriate for continued rehabilitative care in the following setting: potentially CIR. Would like to see increased activity tolerance  before pursuing admission.  Patient has agreed to participate in recommended program. N/A Note that insurance prior authorization may be required for reimbursement for recommended care.  Comment: Rehab Admissions Coordinator to follow up.  Thanks,  Ranelle Oyster, MD, [place]     10/07/2015

## 2015-10-07 NOTE — Progress Notes (Signed)
Pt refused CPT 

## 2015-10-07 NOTE — Consult Note (Signed)
Consultation Note Date: 10/07/2015   Patient Name: [name]  DOB: [birth year]  MRN: [id number]  Age / Sex: 75 y.o., male  PCP: Elias Else, MD Referring Physician: Rodolph Bong, MD  Reason for Consultation: Establishing goals of care   Clinical Assessment/Narrative:  75 yo white male with a h/o Type 2 DM and CVA With residual right-sided weakness who  Was admitted  with SOB, syncope and bradycardia. Patient had flulike symptoms, abdominal pain, nausea, diarrhea and generalized malaise for several days. U[pon EMS arrival, patient was found to be tachypneic and had a brief syncopal episode and bradycardia with HR in the 30s as he was being transferred to the hospital. He was given 1 mg atropine and HR improved to the 70s. Enroute to the ED, he had another episode of bradycardia but HR improved. His blood glucose in the field was in the 500s.   Per wife patient  has had continued slow physical and functional decline.  Now faced with anticipatory care needs and advanced directive decisions     This NP Lorinda Creed reviewed medical records, received report from team, assessed the patient and then meet at the patient's bedside along with his wife [name]  to discuss diagnosis, prognosis, GOC, EOL wishes disposition and options. Today patient is too lethargic to participate in conversation.  We discussed his multiple co-morbid ites and overall poor prognosis, the concept of morality and limitations of medicine.  [name] understands but feels that she needs "more time" to see if her husvand will respond to current medical intervetnions   A detailed discussion was had today regarding advanced directives.  Concepts specific to code status, artifical feeding and hydration, continued IV antibiotics and rehospitalization was had.  The difference between a aggressive medical intervention path  and a palliative comfort care  path for this patient at this time was had.  Values and goals of care important to patient and family were attempted to be elicited.  Concept of Hospice and Palliative Care were discussed  Questions and concerns addressed.  Hard Choices booklet left for review.  MOST form was introduced   Family encouraged to call with questions or concerns.  PMT will continue to support holistically.   Primary Decision Maker: wife with support of the family   SUMMARY OF RECOMMENDATIONS  -treat the treatable, family is hopeful for improvement   Code Status/Advance Care Planning:  DNR      Code Status Orders        Start     Ordered   09/29/15 1140  Do not attempt resuscitation (DNR)   Continuous    Question Answer Comment  In the event of cardiac or respiratory ARREST Do not call a "code blue"   In the event of cardiac or respiratory ARREST Do not perform Intubation, CPR, defibrillation or ACLS   In the event of cardiac or respiratory ARREST Use medication by any route, position, wound care, and other measures to relive pain and suffering. May use oxygen, suction and manual treatment of airway obstruction as needed for comfort.   Comments Per conversation with wife and son; wife states pt has a living will and will bring the papers later today      09/29/15 1140    Code Status History    Date Active Date Inactive Code Status Order ID Comments User Context   09/29/2015 11:39 AM 09/29/2015 11:40 AM DNR [id number]  Marrian Salvage, MD Inpatient   10/05/2015  4:55 PM 09/29/2015 11:39  AM Full Code [id number]  Coralyn HellingVineet Sood, MD ED    Advance Directive Documentation        Most Recent Value   Type of Advance Directive  Healthcare Power of Attorney, Living will   Pre-existing out of facility DNR order (yellow form or pink MOST form)     "MOST" Form in Place?        Other Directives:  MOST form introduced  Palliative Prophylaxis:   Aspiration, Bowel Regimen, Delirium Protocol, Frequent Pain Assessment,  Oral Care and Turn Reposition   Psycho-social/Spiritual:  Support System: Fair Desire for further Chaplaincy support:no Additional Recommendations: Education on Hospice  Prognosis:  Pending decision for life prolonging measures Discharge Planning:     Pending outcomes, it is becoming difficult to care for him at home.  May need to consider alternate care options   Chief Complaint/ Primary Diagnoses: Present on Admission:  . DKA (diabetic ketoacidoses) (HCC) . Acute respiratory failure (HCC) . NSTEMI (non-ST elevated myocardial infarction) (HCC) . Acute systolic CHF (congestive heart failure) (HCC) . Paroxysmal atrial fibrillation (HCC) . Acute renal failure superimposed on stage 3 chronic kidney disease (HCC) . Elevated transaminase level . Acute respiratory failure with hypoxia (HCC) . Wenckebach second degree AV block . Cardiomyopathy, ischemic . Hypotension . Encephalopathy, metabolic  I have reviewed the medical record, interviewed the patient and family, and examined the patient. The following aspects are pertinent.  Past Medical History  Diagnosis Date  . Diabetes mellitus without complication (HCC)   . Stroke [facility](HCC)    Social History   Social History  . Marital Status: Married    Spouse Name: N/A  . Number of Children: N/A  . Years of Education: N/A   Social History Main Topics  . Smoking status: Never Smoker   . Smokeless tobacco: None  . Alcohol Use: No  . Drug Use: None  . Sexual Activity: Not Asked   Other Topics Concern  . None   Social History Narrative  . None   No family history on file. Scheduled Meds: . ampicillin-sulbactam (UNASYN) IV  1.5 g Intravenous Q6H  . antiseptic oral rinse  7 mL Mouth Rinse BID  . aspirin EC  81 mg Oral Daily  . atorvastatin  10 mg Oral q1800  . clopidogrel  75 mg Oral Daily  . dextrose  1 ampule Intravenous Once  . free water  250 mL Oral Q6H  . furosemide  80 mg Oral BID  . guaiFENesin  1,200 mg Oral BID  .  heparin subcutaneous  5,000 Units Subcutaneous 3 times per day  . hydrALAZINE  10 mg Oral 3 times per day  . insulin aspart  0-15 Units Subcutaneous TID WC  . insulin glargine  25 Units Subcutaneous Daily  . isosorbide dinitrate  10 mg Oral TID  . potassium chloride  30 mEq Oral BID   Continuous Infusions: . dextrose 50 mL/hr at 10/06/15 2352   PRN Meds:.haloperidol lactate, ipratropium-albuterol, RESOURCE THICKENUP CLEAR Medications Prior to Admission:  Prior to Admission medications   Medication Sig Start Date End Date Taking? Authorizing Provider  glimepiride (AMARYL) 4 MG tablet Take 4 mg by mouth daily with breakfast.   Yes Historical Provider, MD  insulin glargine (LANTUS) 100 UNIT/ML injection Inject 64 Units into the skin daily.   Yes Historical Provider, MD  lisinopril-hydrochlorothiazide (PRINZIDE,ZESTORETIC) 20-12.5 MG tablet Take 1 tablet by mouth daily.   Yes Historical Provider, MD  metFORMIN (GLUCOPHAGE-XR) 750 MG 24 hr tablet Take 750 mg  by mouth every other day.   Yes Historical Provider, MD  tamsulosin (FLOMAX) 0.4 MG CAPS capsule Take 0.4 mg by mouth 2 (two) times daily.   Yes Historical Provider, MD  terbinafine (LAMISIL) 250 MG tablet Take 250 mg by mouth daily.   Yes Historical Provider, MD   No Known Allergies  Review of Systems  Unable to perform ROS   Physical Exam  Constitutional: He appears lethargic. He appears ill.  Cardiovascular: Normal rate, regular rhythm and normal heart sounds.   Respiratory: He has decreased breath sounds.  Musculoskeletal:       Right shoulder: He exhibits decreased strength.  Neurological: He appears lethargic.  Skin: Skin is warm. Ecchymosis noted. There is erythema.  -noted discoloration of toes and knees, mottling    Vital Signs: BP 133/66 mmHg  Pulse 79  Temp(Src) 97.7 F (36.5 C) (Oral)  Resp 19  Ht 6\' 1"  (1.854 m)  Wt 114 kg (251 lb 5.2 oz)  BMI 33.17 kg/m2  SpO2 96%  SpO2: SpO2: 96 % O2 Device:SpO2: 96  % O2 Flow Rate: .O2 Flow Rate (L/min): 2 L/min  IO: Intake/output summary:  Intake/Output Summary (Last 24 hours) at 10/07/15 0706 Last data filed at 10/07/15 0700  Gross per 24 hour  Intake   2880 ml  Output   1775 ml  Net   1105 ml    LBM: Last BM Date: 10/05/15 Baseline Weight: Weight: 113.399 kg (250 lb) Most recent weight: Weight: 114 kg (251 lb 5.2 oz)      Palliative Assessment/Data:  Flowsheet Rows        Most Recent Value   Intake Tab    Referral Department  Hospitalist   Unit at Time of Referral  ICU   Palliative Care Primary Diagnosis  Pulmonary   Date Notified  10/04/15   Palliative Care Type  New Palliative care   Reason for referral  Clarify Goals of Care   Date of Admission  10/08/2015   # of days IP prior to Palliative referral  7   Clinical Assessment    Psychosocial & Spiritual Assessment    Palliative Care Outcomes       Additional Data Reviewed:  CBC:    Component Value Date/Time   WBC 9.7 10/07/2015 0530   HGB 11.6* 10/07/2015 0530   HCT 35.9* 10/07/2015 0530   PLT 175 10/07/2015 0530   MCV 98.9 10/07/2015 0530   NEUTROABS 4.8 10/23/2015 1642   LYMPHSABS 0.7 10/16/2015 1642   MONOABS 0.3 10/23/2015 1642   EOSABS 0.0 10/15/2015 1642   BASOSABS 0.0 10/24/2015 1642   Comprehensive Metabolic Panel:    Component Value Date/Time   NA 148* 10/07/2015 0340   K 4.1 10/07/2015 0340   CL 110 10/07/2015 0340   CO2 25 10/07/2015 0340   BUN 46* 10/07/2015 0340   CREATININE 1.34* 10/07/2015 0340   GLUCOSE 199* 10/07/2015 0340   CALCIUM 8.6* 10/07/2015 0340   AST 49* 10/07/2015 0340   ALT 141* 10/07/2015 0340   ALKPHOS 66 10/07/2015 0340   BILITOT 1.2 10/07/2015 0340   PROT 5.5* 10/07/2015 0340   ALBUMIN 2.4* 10/07/2015 0340     Time In: 1130 Time Out: 1245 Time Total: 75 min Greater than 50%  of this time was spent counseling and coordinating care related to the above assessment and plan.  Signed by: Lorinda Creed, NP  Canary Brim, NP   10/07/2015, 7:06 AM  Please contact Palliative Medicine Team phone at [phone]  for questions and concerns.

## 2015-10-08 DIAGNOSIS — G9341 Metabolic encephalopathy: Secondary | ICD-10-CM

## 2015-10-08 DIAGNOSIS — E870 Hyperosmolality and hypernatremia: Secondary | ICD-10-CM

## 2015-10-08 DIAGNOSIS — I255 Ischemic cardiomyopathy: Secondary | ICD-10-CM

## 2015-10-08 DIAGNOSIS — E860 Dehydration: Secondary | ICD-10-CM

## 2015-10-08 DIAGNOSIS — Z66 Do not resuscitate: Secondary | ICD-10-CM

## 2015-10-08 LAB — GLUCOSE, CAPILLARY
GLUCOSE-CAPILLARY: 99 mg/dL (ref 65–99)
GLUCOSE-CAPILLARY: 254 mg/dL — AB (ref 65–99)
Glucose-Capillary: 124 mg/dL — ABNORMAL HIGH (ref 65–99)
Glucose-Capillary: 107 mg/dL — ABNORMAL HIGH (ref 65–99)

## 2015-10-08 LAB — CBC
HCT: 35.4 % — ABNORMAL LOW (ref 39.0–52.0)
Hemoglobin: 11.5 g/dL — ABNORMAL LOW (ref 13.0–17.0)
MCH: 31.9 pg (ref 26.0–34.0)
MCHC: 32.5 g/dL (ref 30.0–36.0)
MCV: 98.3 fL (ref 78.0–100.0)
Platelets: 164 10*3/uL (ref 150–400)
RBC: 3.6 MIL/uL — ABNORMAL LOW (ref 4.22–5.81)
RDW: 14.1 % (ref 11.5–15.5)
WBC: 12 10*3/uL — ABNORMAL HIGH (ref 4.0–10.5)

## 2015-10-08 LAB — BASIC METABOLIC PANEL
Anion gap: 9 (ref 5–15)
BUN: 32 mg/dL — ABNORMAL HIGH (ref 6–20)
CO2: 28 mmol/L (ref 22–32)
Calcium: 8.4 mg/dL — ABNORMAL LOW (ref 8.9–10.3)
Chloride: 110 mmol/L (ref 101–111)
Creatinine, Ser: 1.27 mg/dL — ABNORMAL HIGH (ref 0.61–1.24)
GFR calc Af Amer: >60 mL/min (ref >60)
GFR calc non Af Amer: 53 mL/min — ABNORMAL LOW (ref >60)
Glucose, Bld: 130 mg/dL — ABNORMAL HIGH (ref 65–99)
Potassium: 3.3 mmol/L — ABNORMAL LOW (ref 3.5–5.1)
Sodium: 147 mmol/L — ABNORMAL HIGH (ref 135–145)

## 2015-10-08 LAB — MAGNESIUM: Magnesium: 2 mg/dL (ref 1.7–2.4)

## 2015-10-08 MED ORDER — INSULIN ASPART 100 UNIT/ML ~~LOC~~ SOLN
0.0000 [IU] | Freq: Three times a day (TID) | SUBCUTANEOUS | Status: DC
  Administered 2015-10-08: 2 [IU] via SUBCUTANEOUS
  Administered 2015-10-09: 3 [IU] via SUBCUTANEOUS
  Administered 2015-10-09: 2 [IU] via SUBCUTANEOUS

## 2015-10-08 MED ORDER — FUROSEMIDE 80 MG PO TABS
80.0000 mg | ORAL_TABLET | Freq: Two times a day (BID) | ORAL | Status: DC
  Administered 2015-10-08 – 2015-10-09 (×3): 80 mg via ORAL
  Filled 2015-10-08 (×3): qty 1

## 2015-10-08 NOTE — Progress Notes (Signed)
Patient has been moved from [facility] to [facility].  PT is recommending CIR consult.  CSW has spoken to patient's wife already about feasibility of SNF.  CIR would be preferred by wife if this option is offered- [facility].  Fl2 completed and will need to initiate SNF placement as well as monitor for possible CIR intervention.  Blue Medicare auth would be required for either level of care.  Lorri Frederickonna T. Jaci LazierCrowder, KentuckyLCSW [phone]

## 2015-10-08 NOTE — Clinical Social Work Note (Signed)
Clinical Social Work Assessment  Patient Details  Name: [name] MRN: [id number] Date of Birth: June 01, 1941  Date of referral:  10/06/15               Reason for consult:  Facility Placement                Permission sought to share information with:  Family Supports, Magazine features editoracility Contact Representative Permission granted to share information::  Yes, Verbal Permission Granted (By patient's wife.  Patient is not alert and oriented)  Name::     [place] SNF's, Wife      Housing/Transportation Living arrangements for the past 2 months:  Single Family Home Source of Information:  Spouse Patient Interpreter Needed:  None Criminal Activity/Legal Involvement Pertinent to Current Situation/Hospitalization:  No - Comment as needed Significant Relationships:  Spouse Lives with:  Spouse Do you feel safe going back to the place where you live?  No (Wife wants him home as soon as possible but feels he needs rehab.) Need for family participation in patient care:  Yes (Comment)  Care giving concerns:  Wife feels patient's condition has deteriorated to where she does not feel she can manage his care at home at this time. "He's going to need rehab."   Social Worker assessment / plan:  75 year old male admitted from home where he lives with his wife.  Patient's speech is currently very slurred and speech is difficult.  He is also very lethargic and sleepy; defers to his wife at this time for decision making.  CSW spoke with patients wife who was pleased with contact by CSW.  She verbalized that patient would require therapies and extra nursing care prior to returning home and acknowledged that she would not be able to manage him at this time.  Discussed possible need for SNF placement; processes for bed search and provided list of SNF's in patient's room. He has not yet been evaluated by Physical Therapy to determine rehab needs.  Fl2 will be initiated and placed on chart for MD's signature.     Employment status:  Retired Database administratornsurance information:  Managed Medicare PT Recommendations:  Not assessed at this time Information / Referral to community resources:   None at this time  Patient/Family's Response to care:  While wife is pleased with his current medical care- she verbalizes concerns about his condition and how drastic is care needs have increased. She is very worried at this time about his possible recovery.  Patient/Family's Understanding of and Emotional Response to Diagnosis, Current Treatment, and Prognosis: Patient is currently unable to respond verbally or emotionally about his current diagnosis, treatment or prognosis. He is very lethargic and sleepy;  He is also not oriented to his surroundings.  Patient's wife is very involved and is able to verbalize a strong understanding of his medical conditions, however, she is emotionally upset that his condition has deteriorated.  "I worry that he will recover."  Support and encouragement provided to patient's wife.    Emotional Assessment Appearance:  Appears stated age Attitude/Demeanor/Rapport:  Unable to Assess (Speech is slurred, not oriented to place and time) Affect (typically observed):  Unable to Assess Orientation:  Oriented to Self, Oriented to Place, Fluctuating Orientation (Suspected and/or reported Sundowners) Alcohol / Substance use:  Never Used Psych involvement (Current and /or in the community):  No (Comment)  Discharge Needs  Concerns to be addressed:  Care Coordination Readmission within the last 30 days:  No Current discharge risk:  Dependent with Mobility, Cognitively Impaired Barriers to Discharge:  Continued Medical Work up   Toy Baker 10/06/2015  4:30 PM

## 2015-10-08 NOTE — Progress Notes (Signed)
PT is refusing CPT

## 2015-10-08 NOTE — Progress Notes (Signed)
Patient Name: [name] Date of Encounter: 10/08/2015  Principal Problem:   Acute respiratory failure with hypoxia (HCC) Active Problems:   DKA (diabetic ketoacidoses) (HCC)   Acute respiratory failure (HCC)   NSTEMI (non-ST elevated myocardial infarction) (HCC)   Acute systolic CHF (congestive heart failure) (HCC)   Paroxysmal atrial fibrillation (HCC)   Dysphagia   Acute renal failure superimposed on stage 3 chronic kidney disease (HCC)   Elevated transaminase level   Wenckebach second degree AV block   Aspiration pneumonia (HCC)   Cardiomyopathy, ischemic   Hypotension   Encephalopathy, metabolic   Palliative care encounter   Hyperosmolality and/or hypernatremia   Dehydration   Hypernatremia   DNR (do not resuscitate)   Length of Stay: 11  SUBJECTIVE  More alert today. LVEF 20-25% - not a cath candidate due to multiple comorbidities, prior stoke and renal disease. Creatinine mildly improved today to 1.27. Sodium remains elevated at 147. Holding diuresis.    CURRENT MEDS . amoxicillin-clavulanate  1 tablet Oral Q12H  . antiseptic oral rinse  7 mL Mouth Rinse BID  . aspirin EC  81 mg Oral Daily  . atorvastatin  10 mg Oral q1800  . clopidogrel  75 mg Oral Daily  . dextrose  1 ampule Intravenous Once  . free water  250 mL Oral Q6H  . [START ON 07/14/2016] furosemide  80 mg Oral BID  . guaiFENesin  1,200 mg Oral BID  . heparin subcutaneous  5,000 Units Subcutaneous 3 times per day  . hydrALAZINE  10 mg Oral 3 times per day  . insulin aspart  0-15 Units Subcutaneous TID WC  . insulin glargine  30 Units Subcutaneous Daily  . isosorbide dinitrate  10 mg Oral TID  . potassium chloride  30 mEq Oral BID  . tamsulosin  0.4 mg Oral BID    OBJECTIVE   Intake/Output Summary (Last 24 hours) at 10/08/15 1226 Last data filed at 10/08/15 0600  Gross per 24 hour  Intake    800 ml  Output    925 ml  Net   -125 ml   Filed Weights   10/07/15 0400 10/07/15  1548 10/08/15 0500  Weight: 251 lb 5.2 oz (114 kg) 253 lb 15.5 oz (115.2 kg) 257 lb 0.9 oz (116.6 kg)    PHYSICAL EXAM Filed Vitals:   10/07/15 2101 10/08/15 0033 10/08/15 0500 10/08/15 0755  BP: 129/68 131/70 140/73 130/69  Pulse: 56 72 78 78  Temp: 98 F (36.7 C) 98.1 F (36.7 C) 97.4 F (36.3 C) 97.6 F (36.4 C)  TempSrc: Oral Oral Oral Oral  Resp: 22 22 20 20   Height:      Weight:   257 lb 0.9 oz (116.6 kg)   SpO2: 90% 96% 95% 96%   General: Alert, oriented x 2, no distress Head: no evidence of trauma, PERRL, EOMI, no exophtalmos or lid lag, no myxedema, no xanthelasma; normal ears, nose and oropharynx, dry mucous membranes Neck: normal jugular venous pulsations and no hepatojugular reflux; brisk carotid pulses without delay and no carotid bruits Chest: clear to auscultation, no signs of consolidation by percussion or palpation, normal fremitus, symmetrical and full respiratory excursions Cardiovascular: normal position and quality of the apical impulse, regular rhythm, normal first and second heart sounds, no rubs or gallops, no murmur Abdomen: no tenderness or distention, no masses by palpation, no abnormal pulsatility or arterial bruits, normal bowel sounds, no hepatosplenomegaly Extremities: no clubbing, cyanosis or edema; 2+ radial, ulnar and  brachial pulses bilaterally;cannot locate pedal pulses Neurological: right hemiparesis  LABS  CBC  Recent Labs  10/07/15 0530 10/08/15 0400  WBC 9.7 12.0*  HGB 11.6* 11.5*  HCT 35.9* 35.4*  MCV 98.9 98.3  PLT 175 164   Basic Metabolic Panel  Recent Labs  10/07/15 0340 10/08/15 0400  NA 148* 147*  K 4.1 3.3*  CL 110 110  CO2 25 28  GLUCOSE 199* 130*  BUN 46* 32*  CREATININE 1.34* 1.27*  CALCIUM 8.6* 8.4*  MG 2.1 2.0   Liver Function Tests  Recent Labs  10/07/15 0340  AST 49*  ALT 141*  ALKPHOS 66  BILITOT 1.2  PROT 5.5*  ALBUMIN 2.4*    Radiology Studies Imaging results have been reviewed and No  results found.  TELE NSR  ASSESSMENT AND PLAN  1. Non-ST elevation myocardial infarction- Echocardiogram showed severely reduced LV function. However given his previous CVA, renal insufficiency and overall medical condition he is not a candidate for aggressive evaluation such as cardiac catheterization. - Continue aspirin + clopidogrel. - Not on ACE inhibitor given renal insufficiency. Consider starting low dose ACE-I. - On low dose hydralazine, add nitrates. BP in target range on balanced vasodilators - No beta blocker in the setting of acute congestive heart failure and significant AV node conduction abnormalities  2. Acute systolic congestive heart failure/signs of ischemic cardiomyopathy  - improving renal function and reduction in edema with diuresis.Holding diuresis to allow correction of hypernatremia. 3. Abnormal LFTs, likely due to sepsis - improving. Recheck in 2-3 days after addition of statin. 4. History of CVA with residual Rt hemiparesis 5. Acute on chronic renal failure - improved since admission, appears to improve more after diuretics. Most recent assay prior to current illness showed creat 1.5 in 2012. 6. Diabetic ketoacidosis - resolved; not on insulin prior to admission 7. Hypernatremia - increase free water intake. Since he is now alert and can ask for water, should be easier to correct 8. Second degree AV block Mobitz 1, RBBB, LAFB - beta blockers may not be a good idea since he appears to have advanced AV conduction disease.   Once NA level normalizes, will start a scheduled oral dose of diuretic and also start ACEi if renal function remains stable.  Prognosis appears to be poor long-term. I agree with palliative care consultation and suggestions. Family is considering perhaps a comfort approach. Patient is a no CODE BLUE.  Chrystie Nose, MD, [facility] Attending Cardiologist [facility]   10/08/2015 12:26 PM

## 2015-10-08 NOTE — Clinical Social Work Placement (Signed)
   CLINICAL SOCIAL WORK PLACEMENT  NOTE  Date:  10/06/2015 Patient Details  Name: Lockie Pareslfred K Pacifico MRN: [id number] Date of Birth: Jun 11, 1941  Clinical Social Work is seeking post-discharge placement for this patient at the Skilled  Nursing Facility level of care (*CSW will initial, date and re-position this form in  chart as items are completed):  Yes   Patient/family provided with [facility] Clinical Social Work Department's list of facilities offering this level of care within the geographic area requested by the patient (or if unable, by the patient's family).  Yes   Patient/family informed of their freedom to choose among providers that offer the needed level of care, that participate in Medicare, Medicaid or managed care program needed by the patient, have an available bed and are willing to accept the patient.  Yes   Patient/family informed of [facility] ownership interest [facility] and [facility], as well as of the fact that they are under no obligation to receive care at these facilities.  PASRR submitted to EDS on 10/06/15     PASRR number received on 10/06/15     Existing PASRR number confirmed on       FL2 transmitted to all facilities in geographic area requested by pt/family on       FL2 transmitted to all facilities within larger geographic area on       Patient informed that his/her managed care company has contracts with or will negotiate with certain facilities, including the following:   [facility](Blue Medicare)         Patient/family informed of bed offers received.  Patient chooses bed at       Physician recommends and patient chooses bed at      Patient to be transferred to   on  .  Patient to be transferred to facility by Ambulance [facility]AR)     Patient family notified on   of transfer.  Name of family member notified:        PHYSICIAN Please prepare priority discharge summary, including medications, Please sign FL2, Please prepare  prescriptions, Please sign DNR     Additional Comment:    _______________________________________________ Darylene Pricerowder, Soleia Badolato T, LCSW 10/08/2015, 10:39 PM

## 2015-10-08 NOTE — NC FL2 (Signed)
[place] MEDICAID FL2 LEVEL OF CARE SCREENING TOOL     IDENTIFICATION  Patient Name: [name] Birthdate: 1941/04/05 Sex: male Admission Date (Current Location): 10/08/2015  [facility] and [place] Number:  Producer, television/film/video and Address:  [facility] [place], [address] [place], [place] [address]      Provider Number: [id number]  Attending Physician Name and Address:  Rodolph Bong, MD  Relative Name and Phone Number:       Current Level of Care: Hospital Recommended Level of Care: Skilled Nursing Facility Prior Approval Number:  [id number]  Date Approved/Denied:   PASRR Number:    Discharge Plan: SNF    Current Diagnoses: Patient Active Problem List   Diagnosis Date Noted  . DNR (do not resuscitate) 10/07/2015  . Dehydration   . Hypernatremia   . Hyperosmolality and/or hypernatremia   . Aspiration pneumonia (HCC) 10/05/2015  . Cardiomyopathy, ischemic 10/05/2015  . Hypotension 10/05/2015  . Encephalopathy, metabolic 10/05/2015  . Palliative care encounter   . Acute respiratory failure with hypoxia (HCC)   . Wenckebach second degree AV block   . Acute systolic CHF (congestive heart failure) (HCC) 10/03/2015  . Paroxysmal atrial fibrillation (HCC) 10/03/2015  . Dysphagia 10/03/2015  . Acute renal failure superimposed on stage 3 chronic kidney disease (HCC) 10/03/2015  . Elevated transaminase level   . NSTEMI (non-ST elevated myocardial infarction) (HCC) 10/01/2015  . Acute respiratory failure (HCC)   . DKA (diabetic ketoacidoses) (HCC) 10/06/2015  . AKI (acute kidney injury) (HCC)   . Bradycardia   . Lactic acid acidosis     Orientation RESPIRATION BLADDER Height & Weight    Self, Place, Time    Incontinent 6\' 1"  (185.4 cm) 257 lbs  BEHAVIORAL SYMPTOMS/MOOD NEUROLOGICAL BOWEL NUTRITION STATUS      Incontinent  Dysphagia 1 diet  AMBULATORY STATUS COMMUNICATION OF NEEDS Skin:    Extensive Assist Verbally  Dry,Flaky,  Ecchymosis to chest- bilateral arms and legs                       Personal Care Assistance Level of Assistance  Total care       Total Care Assistance: Maximum assistance   Functional Limitations Info  Speech- slurred   PT and OT          SPECIAL CARE FACTORS FREQUENCY     5 X's a week                  Contractures      Additional Factors Info   Alert and oriented to person and time only.    DNR           Current Medications (10/08/2015):  This is the current hospital active medication list Current Facility-Administered Medications  Medication Dose Route Frequency Provider Last Rate Last Dose  . acetaminophen (TYLENOL) tablet 650 mg  650 mg Oral Q6H PRN Rodolph Bong, MD   650 mg at 10/07/15 1218  . amoxicillin-clavulanate (AUGMENTIN) 875-125 MG per tablet 1 tablet  1 tablet Oral Q12H Rodolph Bong, MD   1 tablet at 10/08/15 2103  . antiseptic oral rinse (CPC / CETYLPYRIDINIUM CHLORIDE 0.05%) solution 7 mL  7 mL Mouth Rinse BID Catarina Hartshorn, MD   7 mL at 10/08/15 2103  . aspirin EC tablet 81 mg  81 mg Oral Daily Mihai Croitoru, MD   81 mg at 10/08/15 0947  . atorvastatin (LIPITOR) tablet 10 mg  10 mg Oral q1800 Mihai Croitoru, MD   10 mg at 10/08/15 1704  . clopidogrel (PLAVIX) tablet 75 mg  75 mg Oral Daily Catarina Hartshornavid Tat, MD   75 mg at 10/08/15 0947  . dextrose 5 % solution   Intravenous Continuous Rodolph Bonganiel Thompson V, MD 75 mL/hr at 10/08/15 1257    . dextrose 50 % solution 50 mL  1 ampule Intravenous Once Coralyn HellingVineet Sood, MD   50 mL at 09/30/15 1245  . free water 250 mL  250 mL Oral Q6H Rodolph Bonganiel Thompson V, MD   250 mL at 10/08/15 2103  . [START ON 2016/03/21] furosemide (LASIX) tablet 80 mg  80 mg Oral BID Rodolph Bonganiel Thompson V, MD   80 mg at 10/08/15 0946  . guaiFENesin (MUCINEX) 12 hr tablet 1,200 mg  1,200 mg Oral BID Rodolph Bonganiel Thompson V, MD   1,200 mg at 10/08/15 2102  . heparin injection 5,000 Units  5,000 Units Subcutaneous 3 times per day Lewayne BuntingBrian S Crenshaw, MD    5,000 Units at 10/08/15 2100  . hydrALAZINE (APRESOLINE) tablet 10 mg  10 mg Oral 3 times per day Lewayne BuntingBrian S Crenshaw, MD   10 mg at 10/08/15 2103  . insulin aspart (novoLOG) injection 0-15 Units  0-15 Units Subcutaneous TID WC Rodolph Bonganiel Thompson V, MD   2 Units at 10/08/15 0603  . insulin glargine (LANTUS) injection 30 Units  30 Units Subcutaneous Daily Rodolph Bonganiel Thompson V, MD   30 Units at 10/08/15 [phone]  . ipratropium-albuterol (DUONEB) 0.5-2.5 (3) MG/3ML nebulizer solution 3 mL  3 mL Nebulization Q4H PRN Marrian SalvageJacquelyn S Gill, MD   3 mL at 10/02/15 1210  . isosorbide dinitrate (ISORDIL) tablet 10 mg  10 mg Oral TID Thurmon FairMihai Croitoru, MD   10 mg at 10/08/15 2102  . potassium chloride (K-DUR,KLOR-CON) CR tablet 30 mEq  30 mEq Oral BID Thurmon FairMihai Croitoru, MD   30 mEq at 10/08/15 2102  . RESOURCE THICKENUP CLEAR   Oral PRN Coralyn HellingVineet Sood, MD      . tamsulosin (FLOMAX) capsule 0.4 mg  0.4 mg Oral BID Rodolph Bonganiel Thompson V, MD   0.4 mg at 10/08/15 2101  . traMADol (ULTRAM) tablet 50 mg  50 mg Oral Q6H PRN Rodolph Bonganiel Thompson V, MD         Discharge Medications: Please see discharge summary for a list of discharge medications.  Relevant Imaging Results:  Relevant Lab Results:  Additional Information    SSN: [id number]  Nairi Oswald T, [place]

## 2015-10-08 NOTE — Progress Notes (Signed)
[facility] PROGRESS NOTE  [name] UEA:[id number] DOB: 09-Feb-1941 DOA: 10/06/2015 PCP: [name], MD  Interim Summary 75 year old male with a history of diabetes mellitus type 2, stroke with residual right hemiparesis presented on 10/06/15 with shortness of breath, bradycardia and syncope. The patient had a history of diarrhea abdominal pain and generalized malaise for several days prior to admission. CT abdomen and pelvis at the time of admission showed mild sigmoid thickening thought to be due to muscular hypertrophy without any other acute findings. CT of the chest that time showed bilateral pleural effusions. The patient was given atropine 1 mg with improvement of his heart rate. He was noted to be hypotensive and severely hyperglycemic in the emergency department. In addition, his troponins were elevated with EKG changes. The patient responded to fluid resuscitation. Cardiology was consulted for elevated troponins.   Assessment/Plan: #1 acute respiratory failure with hypoxia Likely multifactorial secondary to acute CHF exacerbation/pulmonary edema and pleural effusions in the setting of probable aspiration pneumonia. Patient on 2 L nasal cannula with sats in the 99. Patient with some coarse rhonchorous breath sounds likely due to aspiration. On 10/04/2015 due to concerns that patient was aspirating given his mental status chest x-ray clinical exam patient was started on IV Unasyn. Chest x-ray from 10/03/2015 consistent with pulmonary edema right lower lobe increased opacity. Patient has been seen by speech therapy and currently on a dysphagia diet. Diuretics on hold secondary to renal function. Continue oral Augmentin.  Follow.  #2 acute systolic heart failure/ischemic cardiomyopathy Questionable etiology. May be secondary to non-STEMI. 2-D echo from 09/29/2015 with a EF of 20-25% with inferolateral akinesia. Patient still with some volume overload on clinical exam.  Patient initially could not be started on ACE inhibitor secondary to renal failure. Renal function improving and maybe ACE could be started, however will defer to cardiology. Patient is -2.0 L during this hospitalization. Current weight is 116.6kg from 114.1kg from 117.8 kg from 120.9 kg on 10/04/2015. Continue aspirin, Plavix,  hydralazine, Isordil. Hold oral diuretics today secondary to hypernatremia. Per cardiology.  #3 probable aspiration pneumonia Patient with probable aspiration per modified barium swallow. Patient would less rhonchorous breath sounds today. Chest x-ray from 10/03/2015 with increased right lower lobe opacity. Patient currently afebrile. WBC trending down. Continue Mucinex. Flutter valve. Continue oral augmentin.  #4 DKA in the setting of type 2 diabetes   hemoglobin A1c 7.4. Patient initially presented in diabetic ketoacidosis with CBGs of 586 and anion gap of 18. Patient was placed on the glucose stabilizer and subsequently transitioned to subcutaneous insulin. CBGs have ranged from 213-316. D5W also likely playing a role.  Increase Lantus to 30 units. Continue sliding scale insulin. Continue meal coverage insulin. Follow and titrate as needed.  #5 non-STEMI Patient noted to have a troponin peaking at 30.52. 2-D echo had a EF of 20-25% with diffuse hypokinesis and akinesis of the inferior lateral myocardium with moderate mitral valve regurgitation. Patient was initially started on a heparin drip. Cardiology consulted and currently following. Patient deemed not a candidate for heart catheterization secondary to his comorbidities including previous CVA, renal insufficiency, overall medical condition. Unable to place on ACE inhibitor secondary to renal insufficiency initially, however with improvement of renal function may possibly be started by cardiology. Unable to place on statin secondary to transaminitis. LFTs trending down. Beta blocker on hold for now per cardiology in the  setting of acute CHF exacerbation and patient's significant AV node conduction abnormalities. Continue current medical regimen of aspirin,  Plavix, diuretics, hydralazine, isordil. Per cardiology.  #6 transaminitis Likely to shock liver/sepsis. Right upper quadrant ultrasound with fatty liver and negative for cholecystitis. Improving.  #7 history of CVA with residual right hemiparesis Continue aspirin and Plavix for secondary stroke prevention. PT/OT.  #8 hypernatremia Continue D5W at 75 mL per hour and follow. Hold lasix today. Continue free water.  #9 second-degree AV block Mobitz 1/RBBB/LAFB Per cardiology.  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 Baseline creatinine 1.1-1.4. Renal function slowly trending down and at baseline. Creatinine peaked at 3.89. Current creatinine currently at 1.27. Hold diuretics today secondary to hypernatremia. Resume diuretics once hypernatremia is resolved.  #11 dysphagia Patient underwent modified barium swallow 10/01/2015. Continue dysphagia 1 diet with nectar thick liquids. Continue aspiration precautions.  #12 acute metabolic encephalopathy Clinical improvement, and likely close to baseline yesterday. Today patient lethargic drowsy however easily arousable. Patient received some Haldol yesterday and per family to washout. CT head negative for any acute cortical abnormalities, moderate cerebral atrophy with progression from prior exam. EEG which was done showed generalized slowing indicating mild-to-moderate cerebral disturbance. No epileptiform activity noted. Patient alert and following some simple commands. Follow.  #13 prophylaxis Heparin for DVT prophylaxis.   Code Status: DO NOT RESUSCITATE Family Communication: Updated family at bedside.  Disposition Plan: Dispo pending palliative  eval. Prob SNF vs hospice pending Palliative.   Consultants:  Cardiology: Dr. Elease Hashimoto Oct 26, 2015  Procedures:  CT head 09/28/2015  CT chest 10/26/15  CT abdomen and pelvis October 26, 2015  Chest x-ray 10/26/15, 09/29/2015, 1 60,017, 10/01/2015, 10/03/2015  Modified barium swallow 10/01/2015  2-D echo 09/29/2015   Lower extremity Dopplers 10/03/2015  Right upper quadrant ultrasound 09/29/2015   EEG 09/28/2015  Antibiotics:  IV vancomycin 09/29/2015>>>> 10/01/2015             IV Zosyn 10-26-2015>>>> 10/02/2015  IV Unasyn 10/04/2015>>>>>10/07/2015             Oral augmentin 10/07/2015  HPI/Subjective: Patient drowsy and lethargic opens eyes to verbal stimuli and consult back to sleep. Patient was given some Haldol yesterday.   Objective: Filed Vitals:   10/08/15 0755 10/08/15 1055  BP: 130/69 136/73  Pulse: 78 85  Temp: 97.6 F (36.4 C) 98.1 F (36.7 C)  Resp: 20 20    Intake/Output Summary (Last 24 hours) at 10/08/15 1646 Last data filed at 10/08/15 1500  Gross per 24 hour  Intake   1080 ml  Output   1700 ml  Net   -620 ml   Filed Weights   10/07/15 0400 10/07/15 1548 10/08/15 0500  Weight: 114 kg (251 lb 5.2 oz) 115.2 kg (253 lb 15.5 oz) 116.6 kg (257 lb 0.9 oz)    Exam:   General:  Laying in bed. Drowsy  Cardiovascular: RRR  Respiratory: CTAB anterior lung fields.  Abdomen: Soft/NT/ND/+BS  Musculoskeletal: No c/c/ trace edema  Data Reviewed: Basic Metabolic Panel:  Recent Labs Lab 10/04/15 0555 10/05/15 0251 10/06/15 0320 10/07/15 0340 10/08/15 0400  NA 145 148* 151* 148* 147*  K 3.5 3.1* 3.5 4.1 3.3*  CL 108 111 111 110 110  CO2 22 25 27 25 28   GLUCOSE 290* 293* 224* 199* 130*  BUN 76* 64* 57* 46* 32*  CREATININE 2.12* 1.86* 1.69* 1.34* 1.27*  CALCIUM 8.5* 8.6* 8.6* 8.6* 8.4*  MG  --  2.3 2.0 2.1 2.0   Liver Function  Tests:  Recent Labs Lab 10/03/15 0310 10/04/15 0555 10/05/15 0251 10/07/15 0340  AST 115* 56* 55* 49*   ALT 370* 265* 223* 141*  ALKPHOS 61 62 66 66  BILITOT 1.4* 1.4* 1.5* 1.2  PROT 6.2* 6.1* 6.3* 5.5*  ALBUMIN 2.8* 2.7* 2.6* 2.4*   No results for input(s): LIPASE, AMYLASE in the last 168 hours. No results for input(s): AMMONIA in the last 168 hours. CBC:  Recent Labs Lab 10/04/15 0555 10/05/15 0251 10/06/15 0320 10/07/15 0530 10/08/15 0400  WBC 11.5* 10.2 10.0 9.7 12.0*  HGB 10.4* 11.0* 11.6* 11.6* 11.5*  HCT 32.2* 34.2* 37.0* 35.9* 35.4*  MCV 100.0 100.0 99.5 98.9 98.3  PLT 159 165 170 175 164   Cardiac Enzymes: No results for input(s): CKTOTAL, CKMB, CKMBINDEX, TROPONINI in the last 168 hours. BNP (last 3 results)  Recent Labs  10/03/15 0310  BNP 2538.6*    ProBNP (last 3 results) No results for input(s): PROBNP in the last 8760 hours.  CBG:  Recent Labs Lab 10/07/15 1221 10/07/15 1643 10/07/15 2140 10/08/15 0529 10/08/15 1143  GLUCAP 285* 253* 186* 124* 107*    Recent Results (from the past 240 hour(s))  Urine culture     Status: None   Collection Time: 09/28/15  7:16 PM  Result Value Ref Range Status   Specimen Description URINE, CLEAN CATCH  Final   Special Requests NONE  Final   Culture NO GROWTH 2 DAYS  Final   Report Status 09/30/2015 FINAL  Final  Culture, blood (Routine X 2) w Reflex to ID Panel     Status: None   Collection Time: 09/30/15 10:20 AM  Result Value Ref Range Status   Specimen Description BLOOD LEFT HAND  Final   Special Requests IN PEDIATRIC BOTTLE 2CC  Final   Culture NO GROWTH 5 DAYS  Final   Report Status 10/05/2015 FINAL  Final  Culture, blood (Routine X 2) w Reflex to ID Panel     Status: None   Collection Time: 09/30/15 10:27 AM  Result Value Ref Range Status   Specimen Description BLOOD LEFT HAND  Final   Special Requests IN PEDIATRIC BOTTLE 3CC  Final   Culture NO GROWTH 5 DAYS  Final   Report Status 10/05/2015 FINAL  Final  Urine culture     Status: None   Collection Time: 10/03/15  1:39 PM  Result Value Ref  Range Status   Specimen Description URINE, CLEAN CATCH  Final   Special Requests NONE  Final   Culture NO GROWTH 1 DAY  Final   Report Status 10/04/2015 FINAL  Final  C difficile quick scan w PCR reflex     Status: None   Collection Time: 10/05/15 10:30 PM  Result Value Ref Range Status   C Diff antigen NEGATIVE NEGATIVE Final   C Diff toxin NEGATIVE NEGATIVE Final   C Diff interpretation Negative for toxigenic C. difficile  Final     Studies: No results found.  Scheduled Meds: . amoxicillin-clavulanate  1 tablet Oral Q12H  . antiseptic oral rinse  7 mL Mouth Rinse BID  . aspirin EC  81 mg Oral Daily  . atorvastatin  10 mg Oral q1800  . clopidogrel  75 mg Oral Daily  . dextrose  1 ampule Intravenous Once  . free water  250 mL Oral Q6H  . [START ON 10/21/2015] furosemide  80 mg Oral BID  . guaiFENesin  1,200 mg Oral BID  . heparin subcutaneous  5,000 Units  Subcutaneous 3 times per day  . hydrALAZINE  10 mg Oral 3 times per day  . insulin aspart  0-15 Units Subcutaneous TID WC  . insulin glargine  30 Units Subcutaneous Daily  . isosorbide dinitrate  10 mg Oral TID  . potassium chloride  30 mEq Oral BID  . tamsulosin  0.4 mg Oral BID   Continuous Infusions: . dextrose 75 mL/hr at 10/08/15 1257    Principal Problem:   Acute respiratory failure with hypoxia (HCC) Active Problems:   NSTEMI (non-ST elevated myocardial infarction) (HCC)   Acute systolic CHF (congestive heart failure) (HCC)   DKA (diabetic ketoacidoses) (HCC)   Acute respiratory failure (HCC)   Paroxysmal atrial fibrillation (HCC)   Dysphagia   Acute renal failure superimposed on stage 3 chronic kidney disease (HCC)   Elevated transaminase level   Wenckebach second degree AV block   Aspiration pneumonia (HCC)   Cardiomyopathy, ischemic   Hypotension   Encephalopathy, metabolic   Palliative care encounter   Hyperosmolality and/or hypernatremia   Dehydration   Hypernatremia   DNR (do not  resuscitate)    Time spent: 40 mins    [facility] MD [facility] Pager [phone]. If 7PM-7AM, please contact night-coverage at [email or web address], password [email or web address] 10/08/2015, 4:46 PM  LOS: 11 days

## 2015-10-09 LAB — BASIC METABOLIC PANEL
ANION GAP: 9 (ref 5–15)
BUN: 26 mg/dL — ABNORMAL HIGH (ref 6–20)
CALCIUM: 8.1 mg/dL — AB (ref 8.9–10.3)
CO2: 26 mmol/L (ref 22–32)
CREATININE: 1.27 mg/dL — AB (ref 0.61–1.24)
Chloride: 108 mmol/L (ref 101–111)
GFR, EST NON AFRICAN AMERICAN: 53 mL/min — AB (ref >60)
Glucose, Bld: 106 mg/dL — ABNORMAL HIGH (ref 65–99)
Potassium: 3.2 mmol/L — ABNORMAL LOW (ref 3.5–5.1)
SODIUM: 143 mmol/L (ref 135–145)

## 2015-10-09 LAB — GLUCOSE, CAPILLARY
GLUCOSE-CAPILLARY: 93 mg/dL (ref 65–99)
GLUCOSE-CAPILLARY: 182 mg/dL — AB (ref 65–99)
GLUCOSE-CAPILLARY: 134 mg/dL — AB (ref 65–99)
Glucose-Capillary: 174 mg/dL — ABNORMAL HIGH (ref 65–99)

## 2015-10-09 LAB — PROTIME-INR
INR: 1.59 — AB (ref 0.00–1.49)
PROTHROMBIN TIME: 19 s — AB (ref 11.6–15.2)

## 2015-10-09 LAB — CBC
HEMATOCRIT: 35.1 % — AB (ref 39.0–52.0)
HEMOGLOBIN: 11.4 g/dL — AB (ref 13.0–17.0)
MCH: 31.6 pg (ref 26.0–34.0)
MCHC: 32.5 g/dL (ref 30.0–36.0)
MCV: 97.2 fL (ref 78.0–100.0)
Platelets: 170 10*3/uL (ref 150–400)
RBC: 3.61 MIL/uL — AB (ref 4.22–5.81)
RDW: 14 % (ref 11.5–15.5)
WBC: 10.8 10*3/uL — ABNORMAL HIGH (ref 4.0–10.5)

## 2015-10-09 MED ORDER — LOPERAMIDE HCL 2 MG PO CAPS
4.0000 mg | ORAL_CAPSULE | Freq: Once | ORAL | Status: AC
  Administered 2015-10-09: 4 mg via ORAL
  Filled 2015-10-09: qty 2

## 2015-10-09 MED ORDER — LORAZEPAM 2 MG/ML IJ SOLN: 1.0000 mg | INTRAMUSCULAR | Status: DC | PRN

## 2015-10-09 MED ORDER — MORPHINE SULFATE (PF) 2 MG/ML IV SOLN: 1.0000 mg | INTRAVENOUS | Status: DC | PRN

## 2015-10-09 MED ORDER — SODIUM CHLORIDE 0.45 % IV SOLN
INTRAVENOUS | Status: DC
  Administered 2015-10-09: 20:00:00 via INTRAVENOUS

## 2015-10-09 MED ORDER — MORPHINE SULFATE (PF) 2 MG/ML IV SOLN
1.0000 mg | INTRAVENOUS | Status: DC | PRN
  Administered 2015-10-09: 1 mg via INTRAVENOUS
  Filled 2015-10-09: qty 1

## 2015-10-09 MED ORDER — LOPERAMIDE HCL 2 MG PO CAPS: 2.0000 mg | ORAL_CAPSULE | ORAL | Status: DC | PRN

## 2015-10-10 LAB — CBC
HCT: 27.2 % — ABNORMAL LOW (ref 39.0–52.0)
HEMOGLOBIN: 8.7 g/dL — AB (ref 13.0–17.0)
MCH: 31.2 pg (ref 26.0–34.0)
MCHC: 32 g/dL (ref 30.0–36.0)
MCV: 97.5 fL (ref 78.0–100.0)
PLATELETS: 241 10*3/uL (ref 150–400)
RBC: 2.79 MIL/uL — ABNORMAL LOW (ref 4.22–5.81)
RDW: 14 % (ref 11.5–15.5)
WBC: 14 10*3/uL — ABNORMAL HIGH (ref 4.0–10.5)

## 2015-10-26 NOTE — Progress Notes (Signed)
Notified by CMT that patient's heart rate was in the 30's. When RN went to room to assess patient, he was pale, cool to touch and apneic. No pulses were palpable and no cardiac rhythm was auscultated with stethoscope-verified by two RN. MD on call notified of patient's status-received verbal order to pronounce death with 2 RN. Primary RN and Charge RN called time of death at 2207. MD to notify family.

## 2015-10-26 NOTE — Progress Notes (Signed)
Patient Name: [name] Date of Encounter: 10/19/2015  Principal Problem:   Acute respiratory failure with hypoxia (HCC) Active Problems:   DKA (diabetic ketoacidoses) (HCC)   Acute respiratory failure (HCC)   NSTEMI (non-ST elevated myocardial infarction) (HCC)   Acute systolic CHF (congestive heart failure) (HCC)   Paroxysmal atrial fibrillation (HCC)   Dysphagia   Acute renal failure superimposed on stage 3 chronic kidney disease (HCC)   Elevated transaminase level   Wenckebach second degree AV block   Aspiration pneumonia (HCC)   Cardiomyopathy, ischemic   Hypotension   Encephalopathy, metabolic   Palliative care encounter   Hyperosmolality and/or hypernatremia   Dehydration   Hypernatremia   DNR (do not resuscitate)   Length of Stay: 12  SUBJECTIVE  Resting comfortably.  Denies SOB.    CURRENT MEDS . amoxicillin-clavulanate  1 tablet Oral Q12H  . antiseptic oral rinse  7 mL Mouth Rinse BID  . aspirin EC  81 mg Oral Daily  . atorvastatin  10 mg Oral q1800  . clopidogrel  75 mg Oral Daily  . dextrose  1 ampule Intravenous Once  . free water  250 mL Oral Q6H  . furosemide  80 mg Oral BID  . guaiFENesin  1,200 mg Oral BID  . heparin subcutaneous  5,000 Units Subcutaneous 3 times per day  . hydrALAZINE  10 mg Oral 3 times per day  . insulin aspart  0-15 Units Subcutaneous TID WC  . insulin glargine  30 Units Subcutaneous Daily  . isosorbide dinitrate  10 mg Oral TID  . potassium chloride  30 mEq Oral BID  . tamsulosin  0.4 mg Oral BID    OBJECTIVE   Intake/Output Summary (Last 24 hours) at 10/19/2015 1124 Last data filed at 10/05/2015 0834  Gross per 24 hour  Intake   2555 ml  Output   1250 ml  Net   1305 ml   Filed Weights   10/07/15 1548 10/08/15 0500 10/04/2015 0423  Weight: 115.2 kg (253 lb 15.5 oz) 116.6 kg (257 lb 0.9 oz) 115.8 kg (255 lb 4.7 oz)    PHYSICAL EXAM Filed Vitals:   10/08/15 0755 10/08/15 1055 10/08/15 2008 10/23/2015  0423  BP: 130/69 136/73 111/55 135/55  Pulse: 78 85 76 83  Temp: 97.6 F (36.4 C) 98.1 F (36.7 C) 98 F (36.7 C) 98 F (36.7 C)  TempSrc: Oral Oral Oral Axillary  Resp: 20 20 18 18   Height:      Weight:    115.8 kg (255 lb 4.7 oz)  SpO2: 96% 96% 98% 98%   General: No distress Head: atraumatic Neck: normal JVD Chest: clear Cardiovascular:  regular rhythm, normal first and second heart sounds, no rubs or gallops, no murmur Abdomen: no tenderness or distention, no masses by palpation, no abnormal pulsatility or arterial bruits, normal bowel sounds, no hepatosplenomegaly Extremities: no clubbing, cyanosis or edema; 2+ radial, ulnar and brachial pulses bilaterally;cannot locate pedal pulses Neurological: right hemiparesis  LABS  CBC  Recent Labs  10/08/15 0400 10/17/2015 0341  WBC 12.0* 10.8*  HGB 11.5* 11.4*  HCT 35.4* 35.1*  MCV 98.3 97.2  PLT 164 170   Basic Metabolic Panel  Recent Labs  10/07/15 0340 10/08/15 0400 10/13/2015 0341  NA 148* 147* 143  K 4.1 3.3* 3.2*  CL 110 110 108  CO2 25 28 26   GLUCOSE 199* 130* 106*  BUN 46* 32* 26*  CREATININE 1.34* 1.27* 1.27*  CALCIUM 8.6* 8.4* 8.1*  MG 2.1 2.0  --    Liver Function Tests  Recent Labs  10/07/15 0340  AST 49*  ALT 141*  ALKPHOS 66  BILITOT 1.2  PROT 5.5*  ALBUMIN 2.4*    Radiology Studies Imaging results have been reviewed and No results found.  TELE NSR  ASSESSMENT AND PLAN  1. Non-ST elevation myocardial infarction- Echocardiogram showed severely reduced LV function. However given his previous CVA, renal insufficiency and overall medical condition he is not a candidate for aggressive evaluation such as cardiac catheterization. - Continue aspirin + clopidogrel. - Not on ACE inhibitor given renal insufficiency. Consider starting low dose ACE-I. - On low dose hydralazine, add nitrates. BP in target range on balanced vasodilators - No beta blocker in the setting of acute congestive heart  failure and significant AV node conduction abnormalities  2. Acute systolic congestive heart failure/signs of ischemic cardiomyopathy  - improving renal function and reduction in edema with diuresis.Holding diuresis to allow correction of hypernatremia. 3. Abnormal LFTs, likely due to sepsis - improving. Recheck in 2-3 days after addition of statin. 4. History of CVA with residual Rt hemiparesis 5. Acute on chronic renal failure - improved since admission, appears to improve more after diuretics. Most recent assay prior to current illness showed creat 1.5 in 2012. 6. Diabetic ketoacidosis - resolved; not on insulin prior to admission 7. Hypernatremia - improved with holding diuretics. May need to resume oral diuretic prior to DC. 8. Second degree AV block Mobitz 1, RBBB, LAFB - beta blockers may not be a good idea since he appears to have advanced AV conduction disease.   Prognosis appears to be poor long-term. Patient is a no CODE BLUE. Palliative care involved.   Zylen Wenig [place] MD, [facility]   November 08, 2015 11:24 AM

## 2015-10-26 NOTE — Progress Notes (Addendum)
Was notified by nursing staff that pt had episode of bright red blood per rectum with clots, it was moderate in amount. Vitals were stable. This was a new finding and pt has no history of GI bleeding. I was also told that he was having declining mental status as the evening progressed.   I went to pt's bedside to evaluate situation. Pt was mouth breathing and not responsive to verbal stimuli. Lungs were mostly clear, occasional crackles diffusely. Heart regular rate and rhythm. Abd soft, non tender. Edema present in arms bilaterally. Did not follow any instructions.  I spoke with the patient's wife [name] over the phone (as she had just left) and updated her on the rectal bleeding and changes in overall condition. She was aware, and stated that she realized that there was nothing that we could do for him at this point other than to make him comfortable. She stated that she had discussed comfort measures with palliative care previously and wished to proceed with full comfort measures this evening. Pt will be placed on PRN morphine/ativan, and all other meds/vitals/CBGs will be discontinued.    Addendum: Above discussion was held with [name] shortly after 9pm. A little after 10pm HR dropped into 30s, breathing ceased, then asystole. Pt pronounced dead at 2207. I spoke with [name] to inform her of her husband's death and she stated she was thankful for the care he received.

## 2015-10-26 NOTE — Progress Notes (Signed)
[facility] PROGRESS NOTE  [name] UEA:[id number] DOB: 10-21-1940 DOA: October 25, 2015 PCP: [name], MD  Interim Summary 75 year old male with a history of diabetes mellitus type 2, stroke with residual right hemiparesis presented on 10-25-2015 with shortness of breath, bradycardia and syncope. The patient had a history of diarrhea abdominal pain and generalized malaise for several days prior to admission. CT abdomen and pelvis at the time of admission showed mild sigmoid thickening thought to be due to muscular hypertrophy without any other acute findings. CT of the chest that time showed bilateral pleural effusions. The patient was given atropine 1 mg with improvement of his heart rate. He was noted to be hypotensive and severely hyperglycemic in the emergency department. In addition, his troponins were elevated with EKG changes. The patient responded to fluid resuscitation. Cardiology was consulted for elevated troponins.   Assessment/Plan: #1 acute respiratory failure with hypoxia Likely multifactorial secondary to acute CHF exacerbation/pulmonary edema and pleural effusions in the setting of probable aspiration pneumonia. Patient on 2 L nasal cannula with sats in the 99. Patient with some coarse rhonchorous breath sounds likely due to aspiration. On 10/04/2015 due to concerns that patient was aspirating given his mental status chest x-ray clinical exam patient was started on IV Unasyn. Chest x-ray from 10/03/2015 consistent with pulmonary edema right lower lobe increased opacity. Patient has been seen by speech therapy and currently on a dysphagia diet. Resume  Oral diuretics. Continue oral Augmentin.  Follow.  #2 acute systolic heart failure/ischemic cardiomyopathy Questionable etiology. May be secondary to non-STEMI. 2-D echo from 09/29/2015 with a EF of 20-25% with inferolateral akinesia. Patient still with some volume overload on clinical exam. Patient initially could  not be started on ACE inhibitor secondary to renal failure. Renal function improving and maybe ACE could be started, however will defer to cardiology. Patient is -2.0 L during this hospitalization. Current weight is 116.6kg from 114.1kg from 117.8 kg from 120.9 kg on 10/04/2015. Continue aspirin, Plavix,  hydralazine, Isordil. Hold oral diuretics today secondary to hypernatremia. Per cardiology.  #3 probable aspiration pneumonia Patient with probable aspiration per modified barium swallow. Patient would less rhonchorous breath sounds today. Chest x-ray from 10/03/2015 with increased right lower lobe opacity. Patient currently afebrile. WBC trending down. Continue Mucinex. Flutter valve. Continue oral augmentin.  #4 DKA in the setting of type 2 diabetes   hemoglobin A1c 7.4. Patient initially presented in diabetic ketoacidosis with CBGs of 586 and anion gap of 18. Patient was placed on the glucose stabilizer and subsequently transitioned to subcutaneous insulin. CBGs have ranged from 213-316. D5W also likely playing a role.  Increase Lantus to 30 units. Continue sliding scale insulin. Continue meal coverage insulin. Follow and titrate as needed.  #5 non-STEMI Patient noted to have a troponin peaking at 30.52. 2-D echo had a EF of 20-25% with diffuse hypokinesis and akinesis of the inferior lateral myocardium with moderate mitral valve regurgitation. Patient was initially started on a heparin drip. Cardiology consulted and currently following. Patient deemed not a candidate for heart catheterization secondary to his comorbidities including previous CVA, renal insufficiency, overall medical condition. Unable to place on ACE inhibitor secondary to renal insufficiency initially, however with improvement of renal function may possibly be started by cardiology. Unable to place on statin secondary to transaminitis. LFTs trending down. Beta blocker on hold for now per cardiology in the setting of acute CHF  exacerbation and patient's significant AV node conduction abnormalities. Continue current medical regimen of aspirin, Plavix, diuretics, hydralazine,  isordil. Per cardiology.  #6 transaminitis Likely to shock liver/sepsis. Right upper quadrant ultrasound with fatty liver and negative for cholecystitis. Improving.  #7 history of CVA with residual right hemiparesis Continue aspirin and Plavix for secondary stroke prevention. PT/OT.  #8 hypernatremia Hyponatremia improved. Saline lock IV fluids. Continue free water.  #9 second-degree AV block Mobitz 1/RBBB/LAFB Per cardiology.  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 Baseline creatinine 1.1-1.4. Renal function slowly trending down and at baseline. Creatinine peaked at 3.89. Current creatinine currently at 1.27. Hyponatremia improved. Resume oral diuretics. Follow.  #11 dysphagia Patient underwent modified barium swallow 10/01/2015. Continue dysphagia 1 diet with nectar thick liquids. Continue aspiration precautions.  #12 acute metabolic encephalopathy Clinical improvement, and likely close to baseline yesterday. Today patient lethargic drowsy however easily arousable. Patient received some Haldol yesterday and per family to washout. CT head negative for any acute cortical abnormalities, moderate cerebral atrophy with progression from prior exam. EEG which was done showed generalized slowing indicating mild-to-moderate cerebral disturbance. No epileptiform activity noted. Patient alert and following some simple commands. Follow.  #13 prophylaxis Heparin for DVT prophylaxis.   Code Status: DO NOT RESUSCITATE Family Communication: Updated family at bedside.  Disposition Plan: Dispo pending palliative eval. Prob SNF vs hospice pending Palliative.   Consultants:  Cardiology: Dr.  Elease Hashimoto 10/16/2015  Procedures:  CT head 09/28/2015  CT chest 10/05/2015  CT abdomen and pelvis 10/17/2015  Chest x-ray 10/19/2015, 09/29/2015, 1 60,017, 10/01/2015, 10/03/2015  Modified barium swallow 10/01/2015  2-D echo 09/29/2015   Lower extremity Dopplers 10/03/2015  Right upper quadrant ultrasound 09/29/2015   EEG 09/28/2015  Antibiotics:  IV vancomycin 09/29/2015>>>> 10/01/2015             IV Zosyn 10/25/2015>>>> 10/02/2015  IV Unasyn 10/04/2015>>>>>10/07/2015             Oral augmentin 10/07/2015  HPI/Subjective: Patient drowsy and lethargic opens eyes to verbal stimuli and falls back to sleep.   Objective: Filed Vitals:   10/08/15 2008 11-05-15 0423  BP: 111/55 135/55  Pulse: 76 83  Temp: 98 F (36.7 C) 98 F (36.7 C)  Resp: 18 18    Intake/Output Summary (Last 24 hours) at 11-05-15 1156 Last data filed at 11/05/15 0834  Gross per 24 hour  Intake   2555 ml  Output   1250 ml  Net   1305 ml   Filed Weights   10/07/15 1548 10/08/15 0500 November 05, 2015 0423  Weight: 115.2 kg (253 lb 15.5 oz) 116.6 kg (257 lb 0.9 oz) 115.8 kg (255 lb 4.7 oz)    Exam:   General:  Laying in bed. Drowsy/ lethargic.  Cardiovascular: RRR  Respiratory: CTAB anterior lung fields.  Abdomen: Soft/NT/ND/+BS  Musculoskeletal: No c/c/ trace edema  Data Reviewed: Basic Metabolic Panel:  Recent Labs Lab 10/05/15 0251 10/06/15 0320 10/07/15 0340 10/08/15 0400 2015-11-05 0341  NA 148* 151* 148* 147* 143  K 3.1* 3.5 4.1 3.3* 3.2*  CL 111 111 110 110 108  CO2 25 27 25 28 26   GLUCOSE 293* 224* 199* 130* 106*  BUN 64* 57* 46* 32* 26*  CREATININE 1.86* 1.69* 1.34* 1.27* 1.27*  CALCIUM 8.6* 8.6* 8.6* 8.4* 8.1*  MG 2.3 2.0 2.1 2.0  --    Liver Function Tests:  Recent Labs Lab 10/03/15 0310 10/04/15 0555 10/05/15 0251 10/07/15 0340  AST 115* 56* 55* 49*  ALT 370* 265* 223* 141*  ALKPHOS 61 62 66 66  BILITOT 1.4* 1.4* 1.5* 1.2  PROT 6.2* 6.1* 6.3* 5.5*  ALBUMIN  2.8* 2.7* 2.6* 2.4*   No results for input(s): LIPASE, AMYLASE in the last 168 hours. No results for input(s): AMMONIA in the last 168 hours. CBC:  Recent Labs Lab 10/05/15 0251 10/06/15 0320 10/07/15 0530 10/08/15 0400 October 19, 2015 0341  WBC 10.2 10.0 9.7 12.0* 10.8*  HGB 11.0* 11.6* 11.6* 11.5* 11.4*  HCT 34.2* 37.0* 35.9* 35.4* 35.1*  MCV 100.0 99.5 98.9 98.3 97.2  PLT 165 170 175 164 170   Cardiac Enzymes: No results for input(s): CKTOTAL, CKMB, CKMBINDEX, TROPONINI in the last 168 hours. BNP (last 3 results)  Recent Labs  10/03/15 0310  BNP 2538.6*    ProBNP (last 3 results) No results for input(s): PROBNP in the last 8760 hours.  CBG:  Recent Labs Lab 10/08/15 1143 10/08/15 1622 10/08/15 2132 Oct 19, 2015 0520 2015/10/19 1141  GLUCAP 107* 99 254* 93 182*    Recent Results (from the past 240 hour(s))  Culture, blood (Routine X 2) w Reflex to ID Panel     Status: None   Collection Time: 09/30/15 10:20 AM  Result Value Ref Range Status   Specimen Description BLOOD LEFT HAND  Final   Special Requests IN PEDIATRIC BOTTLE 2CC  Final   Culture NO GROWTH 5 DAYS  Final   Report Status 10/05/2015 FINAL  Final  Culture, blood (Routine X 2) w Reflex to ID Panel     Status: None   Collection Time: 09/30/15 10:27 AM  Result Value Ref Range Status   Specimen Description BLOOD LEFT HAND  Final   Special Requests IN PEDIATRIC BOTTLE 3CC  Final   Culture NO GROWTH 5 DAYS  Final   Report Status 10/05/2015 FINAL  Final  Urine culture     Status: None   Collection Time: 10/03/15  1:39 PM  Result Value Ref Range Status   Specimen Description URINE, CLEAN CATCH  Final   Special Requests NONE  Final   Culture NO GROWTH 1 DAY  Final   Report Status 10/04/2015 FINAL  Final  C difficile quick scan w PCR reflex     Status: None   Collection Time: 10/05/15 10:30 PM  Result Value Ref Range Status   C Diff antigen NEGATIVE NEGATIVE Final   C Diff toxin NEGATIVE NEGATIVE Final   C  Diff interpretation Negative for toxigenic C. difficile  Final     Studies: No results found.  Scheduled Meds: . amoxicillin-clavulanate  1 tablet Oral Q12H  . antiseptic oral rinse  7 mL Mouth Rinse BID  . aspirin EC  81 mg Oral Daily  . atorvastatin  10 mg Oral q1800  . clopidogrel  75 mg Oral Daily  . dextrose  1 ampule Intravenous Once  . free water  250 mL Oral Q6H  . furosemide  80 mg Oral BID  . guaiFENesin  1,200 mg Oral BID  . heparin subcutaneous  5,000 Units Subcutaneous 3 times per day  . hydrALAZINE  10 mg Oral 3 times per day  . insulin aspart  0-15 Units Subcutaneous TID WC  . insulin glargine  30 Units Subcutaneous Daily  . isosorbide dinitrate  10 mg Oral TID  . potassium chloride  30 mEq Oral BID  . tamsulosin  0.4 mg Oral BID   Continuous Infusions:    Principal Problem:   Acute respiratory failure with hypoxia (HCC) Active  Problems:   NSTEMI (non-ST elevated myocardial infarction) (HCC)   Acute systolic CHF (congestive heart failure) (HCC)   DKA (diabetic ketoacidoses) (HCC)   Acute respiratory failure (HCC)   Paroxysmal atrial fibrillation (HCC)   Dysphagia   Acute renal failure superimposed on stage 3 chronic kidney disease (HCC)   Elevated transaminase level   Wenckebach second degree AV block   Aspiration pneumonia (HCC)   Cardiomyopathy, ischemic   Hypotension   Encephalopathy, metabolic   Palliative care encounter   Hyperosmolality and/or hypernatremia   Dehydration   Hypernatremia   DNR (do not resuscitate)    Time spent: 40 mins    [facility] Surgery CenterHOMPSON,Jacob Wimer MD [facility] Pager [phone]. If 7PM-7AM, please contact night-coverage at [email or web address], password [email or web address] 10/12/2015, 11:56 AM  LOS: 12 days

## 2015-10-26 DEATH — deceased

## 2015-11-23 NOTE — Discharge Summary (Signed)
Death Summary  [name] ZOX:[id number] DOB: 09-29-1940 DOA: 10-26-15  PCP: [name], MD PCP/Office notified:   Admit date: 10/26/2015 Date of Death: 2015/11/07  Final Diagnoses:  Principal Problem:   Acute respiratory failure with hypoxia (HCC) Active Problems:   NSTEMI (non-ST elevated myocardial infarction) (HCC)   Acute systolic CHF (congestive heart failure) (HCC)   DKA (diabetic ketoacidoses) (HCC)   Acute respiratory failure (HCC)   Paroxysmal atrial fibrillation (HCC)   Dysphagia   Acute renal failure superimposed on stage 3 chronic kidney disease (HCC)   Elevated transaminase level   Wenckebach second degree AV block   Aspiration pneumonia (HCC)   Cardiomyopathy, ischemic   Hypotension   Encephalopathy, metabolic   Palliative care encounter   Hyperosmolality and/or hypernatremia   Dehydration   Hypernatremia   DNR (do not resuscitate)     History of present illness:  Per Dr Isaiah Serge This is a 75 yo white male with a h/o Type 2 DM and CVA With residual right-sided weakness who presents via EMS with SOB, syncope and bradycardia. History is obtained from ED records as patient does not recall what brought him to the hospital. EMS was called for acute onset shortness of breath. Patient had flulike symptoms, abdominal pain, nausea, diarrhea and generalized malaise for several days. Last night patient became SOB. This afternoon, symptoms got worse and EMS was called. U[pon EMS arrival, patient was found to be tachypneic and had a brief syncopal episode and bradycardia with HR in the 30s as he was being transferred to the hospital. He was given 1 mg atropine and HR improved to the 70s. Enroute to the ED, he had another episode of bradycardia but HR improved. His blood glucose in the field was in the 500s. Patient is currently alert. He denies pain, sob, chest pain, nausea and vomiting.  In the ED, patient was hypotensive, severely hyperglycemic, his troponin  was elevated and he had some EKG changes. Patient subsequently admitted to the critical care service.   Hospital Course:  #1 acute respiratory failure with hypoxia Likely multifactorial secondary to acute CHF exacerbation/pulmonary edema and pleural effusions in the setting of probable aspiration pneumonia. Patient on 2 L nasal cannula with sats in the 99. Patient with some coarse rhonchorous breath sounds likely due to aspiration. On 10/04/2015 due to concerns that patient was aspirating given his mental status chest x-ray clinical exam patient was started on IV Unasyn. Chest x-ray from 10/03/2015 consistent with pulmonary edema right lower lobe increased opacity. Patient has been seen by speech therapy and placed on a dysphagia diet. Patient was subsequently transitioned to oral antibiotics and oral diuretics. Patient condition deteriorated during the hospitalization and on the night of November 07, 2015 due to worsening condition family decided on full comfort measures. Patient was made comfort of her. Patient deteriorated rapidly and was pronounced dead at 27-Feb-2206 hrs. on November 07, 2015.   #2 acute systolic heart failure/ischemic cardiomyopathy Likely secondary to non-STEMI. 2-D echo from 09/29/2015 with a EF of 20-25% with inferolateral akinesia. Patient still with some volume overload on clinical exam. Patient initially could not be started on ACE inhibitor secondary to renal failure. Patient diuresed well during the hospitalization. Patient was maintained on aspirin, Plavix, hydralazine, Isordil. Diuretics were held due to significant hypernatremia. Patient was euvolemic at that time. Patient's condition worsened during the hospitalization palliative care consultation was obtained. Patient tolerated on the night of 11/07/15 and family decided on full comfort measures at that time. Patient was made comfortable. Patient  condition deteriorated rapidly and was pronounced dead at 01/30/06 hrs. on 10/17/2015. Hol  #3  probable aspiration pneumonia Patient with probable aspiration per modified barium swallow. Patient noted to have rhonchorous breath sounds. Chest x-ray from 10/03/2015 with increased right lower lobe opacity. Patient remained afebrile. Patient was maintained on antibiotics and subsequently transitioned to oral Augmentin.  #4 DKA in the setting of type 2 diabetes  hemoglobin A1c 7.4. Patient initially presented in diabetic ketoacidosis with CBGs of 586 and anion gap of 18. Patient was placed on the glucose stabilizer and subsequently transitioned to subcutaneous insulin. Patient was subsequently maintained on Lantus and sliding scale insulin.   #5 non-STEMI Patient noted to have a troponin peaking at 30.52. 2-D echo had a EF of 20-25% with diffuse hypokinesis and akinesis of the inferior lateral myocardium with moderate mitral valve regurgitation. Patient was initially started on a heparin drip. Cardiology consulted and followed the patient throughout the hospitalization. Patient deemed not a candidate for heart catheterization secondary to his comorbidities including previous CVA, renal insufficiency, overall medical condition. Unable to place on ACE inhibitor secondary to renal insufficiency initially. Unable to place on statin secondary to transaminitis. Beta blocker on hold for now per cardiology in the setting of acute CHF exacerbation and patient's significant AV node conduction abnormalities.  Patient was maintained on medical regimen of aspirin, Plavix, diuretics, hydralazine, isordil.  Patient's condition worsened during the hospitalization palliative care consultation was obtained patient was made a DO NOT RESUSCITATE. Patient was monitored. Patient's condition deteriorated on 10-17-2015. Family was informed. Family decided on full comfort measures. Patient was made comfortable. Patient's deterred it rapidly and was pronounced dead at 2206/01/30 hrs. on 17-Oct-2015.   #6 transaminitis Likely to shock  liver/sepsis. Right upper quadrant ultrasound with fatty liver and negative for cholecystitis.  #7 history of CVA with residual right hemiparesis Continued on aspirin and Plavix for secondary stroke prevention throughout the hospitalization.  #8 hypernatremia During the hospitalization patient was noted to be hyponatremic which was felt to be secondary to significant dehydration. Diuretics were held. Free water was increased. Patient had some improvement with the hypernatremia.   #9 second-degree AV block Mobitz 1/RBBB/LAFB Per cardiology.  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 Baseline creatinine 1.1-1.4. Patient was initially hydrated with IV fluids and when he was in heart failure was diuresed with improvement in renal function. Creatinine peaked at 3.89. Patient mental status deteriorated patient continued to decline during the hospitalization. Palliative care consultation was obtained. Patient was made a DO NOT RESUSCITATE. Patient was monitored. On the night of 10-17-2015 patient's condition worsened family decided on full comfort measures and patient was placed on as needed morphine and Ativan. Patient deteriorated rapidly and was pronounced dead at 01-30-2206 hrs. on 10-17-2015.  #11 dysphagia Patient underwent modified barium swallow 10/01/2015. Patient was placed on dysphagia 1 diet with nectar thick liquids. Patient was placed on aspiration precautions. Patient's condition deteriorated palliative care followed the patient. On the night of Oct 17, 2015 due to patient's worsening condition patient was made for comfort. Patient subsequently died at 01/30/06 hrs. on 10-17-2015.   #12 acute metabolic encephalopathy During the hospitalization patient had some waxing and waning acute metabolic encephalopathies. Patient had improved initially  but then became more lethargic and drowsy however was easily arousable. CT head negative for any acute cortical abnormalities, moderate cerebral atrophy with progression from prior  exam. EEG which was done showed generalized slowing indicating mild-to-moderate cerebral disturbance. No epileptiform activity noted. Patient as noted to have a poor prognosis secondary to his multiple comorbidities including acute on chronic heart failure with an ejection fraction of 20-25% with wall motion abnormalities. Patient also with a second-degree AV block Mobitz type I, chronic kidney disease, NSTEMI, type 2 diabetes and had an aspiration pneumonia. Patient will also presented in diabetic ketoacidosis. Positive care consultation was obtained and followed the patient throughout the hospitalization. Patient was made a DO NOT RESUSCITATE. Patient's condition deteriorated during the hospitalization. Patient on the night of 03-Nov-2015 had an episode of bright red blood per rectum with clots however his vitals remained stable. Patient with no prior history of GI bleed. Patient's mental status continued to decline and patient was not responsive to verbal stimuli. Patient's wife was informed and have discussed comfort measures with palliative care previously and wished to proceed with full comfort measures on the night of Nov 03, 2015. Patient was made full comfort and placed on as needed Ativan and morphine. Patient's heart rate dropped into the 30s and subsequently went into asystole. Patient was pronounced dead at 2207hours on November 03, 2015. May his soul rest in peace.     Time: 65 mins  Signed:  THOMPSON,DANIEL  [facility] 11/02/2015, 10:47 PM

## 2017-11-01 IMAGING — CT CT HEAD W/O CM
2 series · 15 of 30 positions shown, 19 images · non-contrast
Comparison: 05/03/2007

CLINICAL DATA: Increase confusion, difficulty swallowing

EXAM:
CT HEAD WITHOUT CONTRAST
TECHNIQUE: Contiguous axial images were obtained from the base of the skull
through the vertex without intravenous contrast.

[Series 201: head w/o, idose (1) · axial · non-contrast · 0.49mm/px · z∈[+534,+669]mm · 13 of 33 slices shown, 17 images]
[im 3/33  brain]
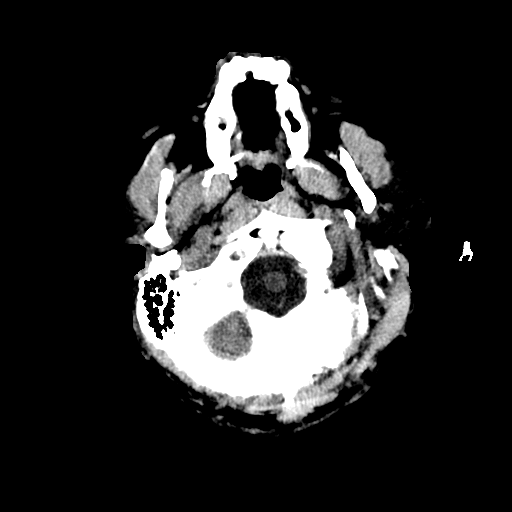
[im 3/33  bone]
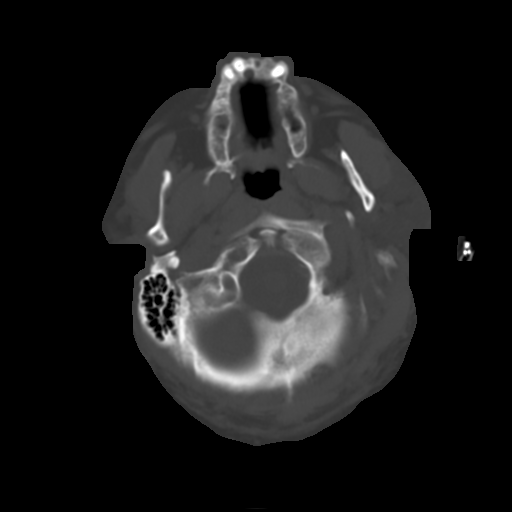
[im 5/33  brain]
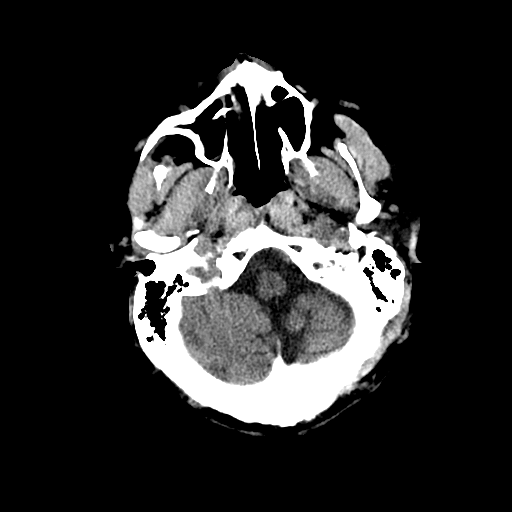
[im 7/33  brain]
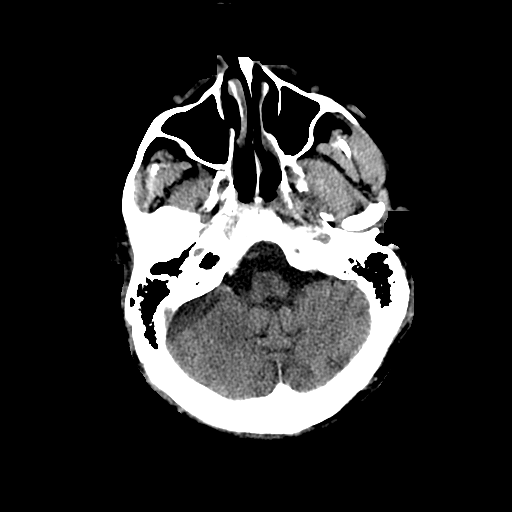
[im 10/33  brain]
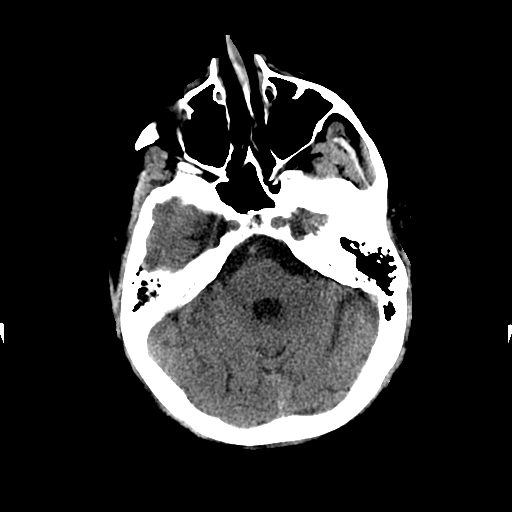
[im 12/33  brain]
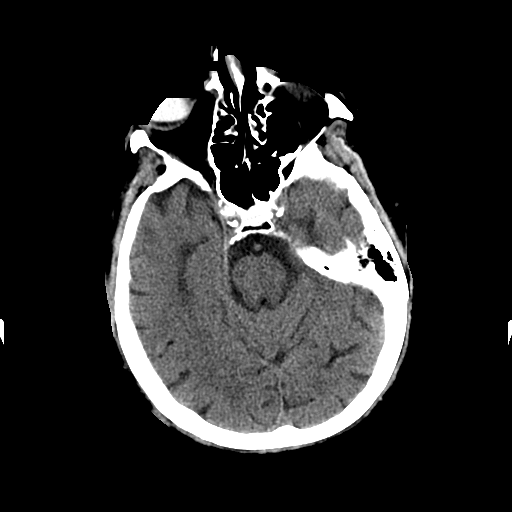
[im 12/33  bone]
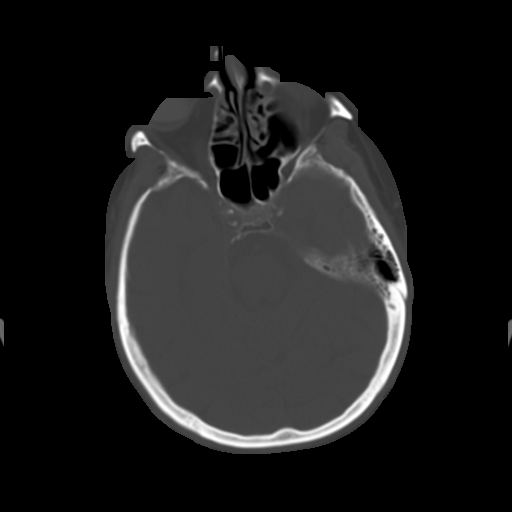
[im 14/33  brain]
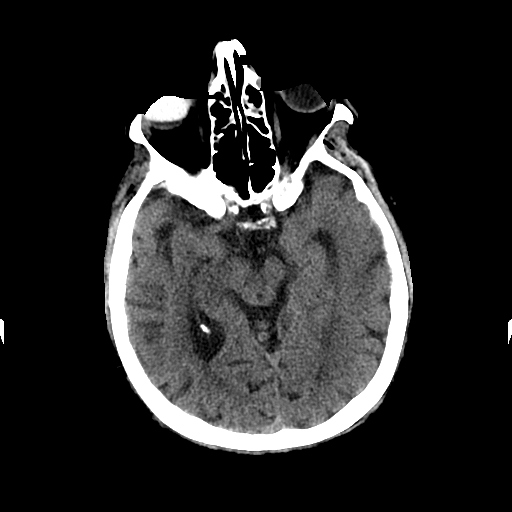
[im 17/33  brain]
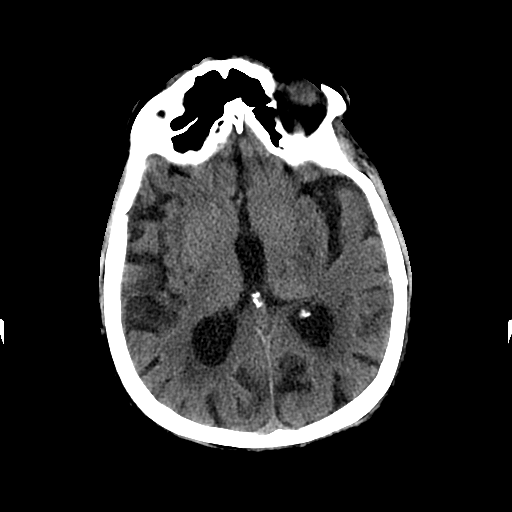
[im 19/33  brain]
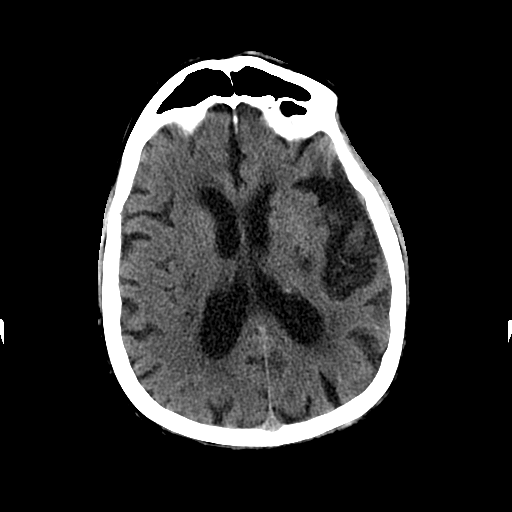
[im 21/33  brain]
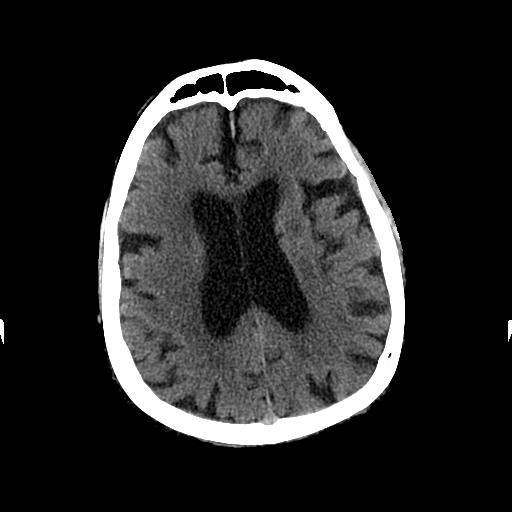
[im 21/33  bone]
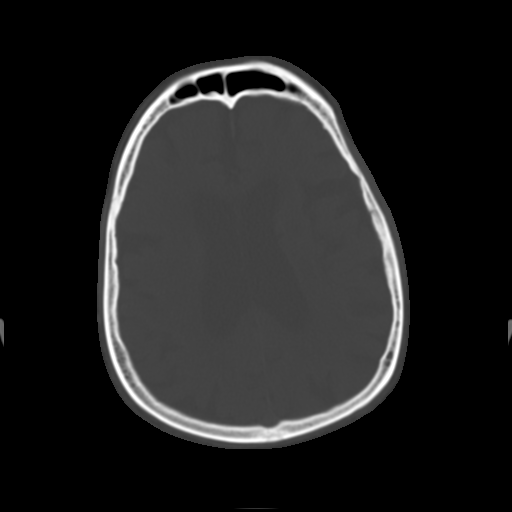
[im 23/33  brain]
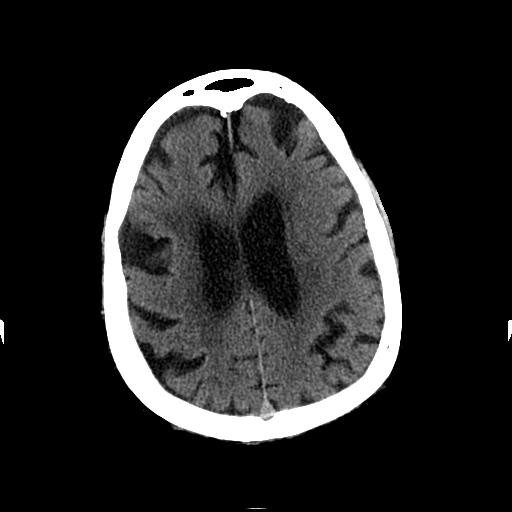
[im 26/33  brain]
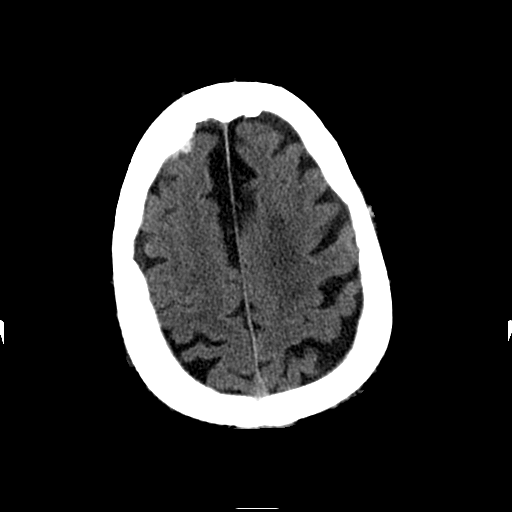
[im 28/33  brain]
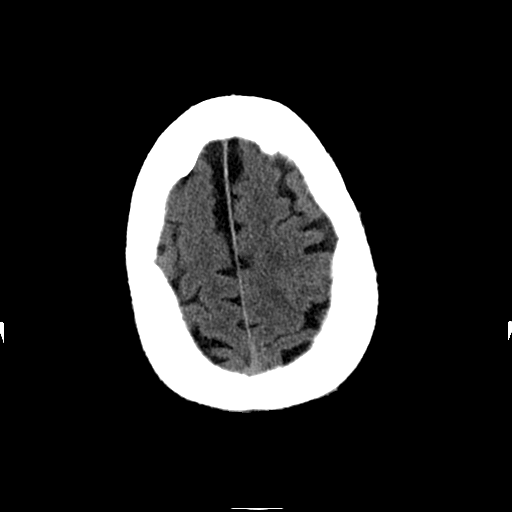
[im 30/33  brain]
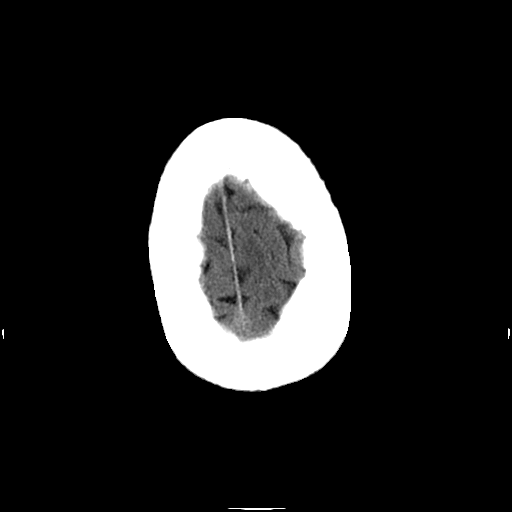
[im 30/33  bone]
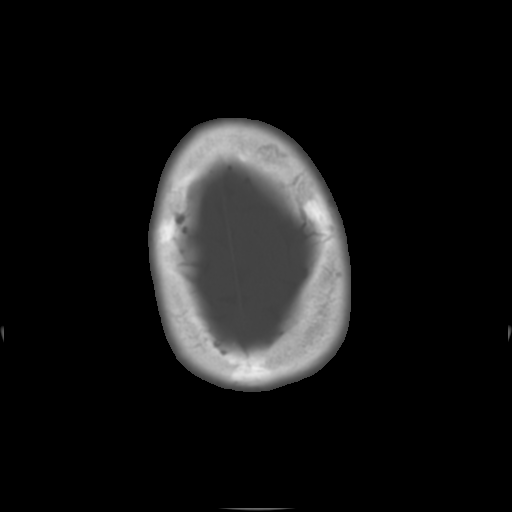

[Series 202: head w/o bone, idose (1) · axial · non-contrast · 0.49mm/px · z∈[+534,+554]mm · 2 of 33 slices shown]
[im 3/33  bone]
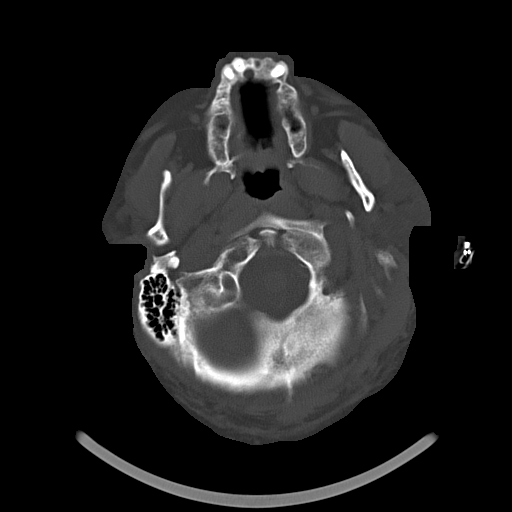
[im 7/33  bone]
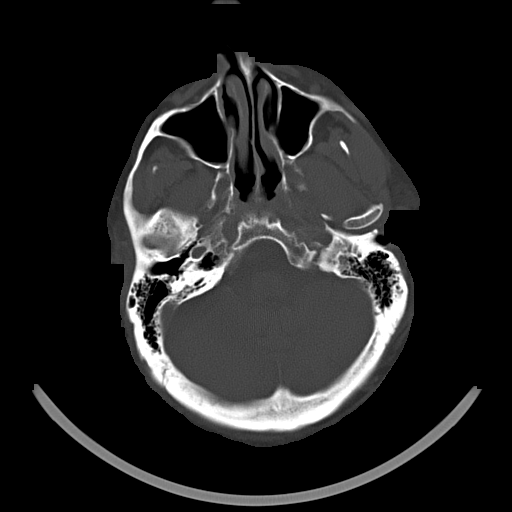

[15 of 30 positions shown; findings below may reference images not displayed]

FINDINGS: No skull fracture is noted. There is mucosal thickening right
maxillary sinus. The mastoid air cells are unremarkable. No
intracranial hemorrhage, mass effect or midline shift. Moderate
cerebral atrophy with progression from prior exam. There is
periventricular and patchy subcortical white matter decreased
attenuation consistent with chronic small vessel ischemic changes.
Small lacunar infarct is noted in left posterior corona radiata. No
definite acute cortical infarction. No mass lesion is noted on this
unenhanced scan.
IMPRESSION: No acute intracranial abnormality. No definite acute cortical
infarction. Moderate cerebral atrophy with progression from prior
exam. Small lacunar infarct noted in left posterior corona radiata.
Periventricular and patchy subcortical white matter decreased
attenuation probable due to chronic small vessel ischemic changes.

## 2017-11-06 IMAGING — DX DG CHEST 1V PORT
1 series · 1 of 1 positions shown · non-contrast
Comparison: Two days ago

CLINICAL DATA: Acute respiratory failure with hypoxia

EXAM:
PORTABLE CHEST 1 VIEW

[chest ap]
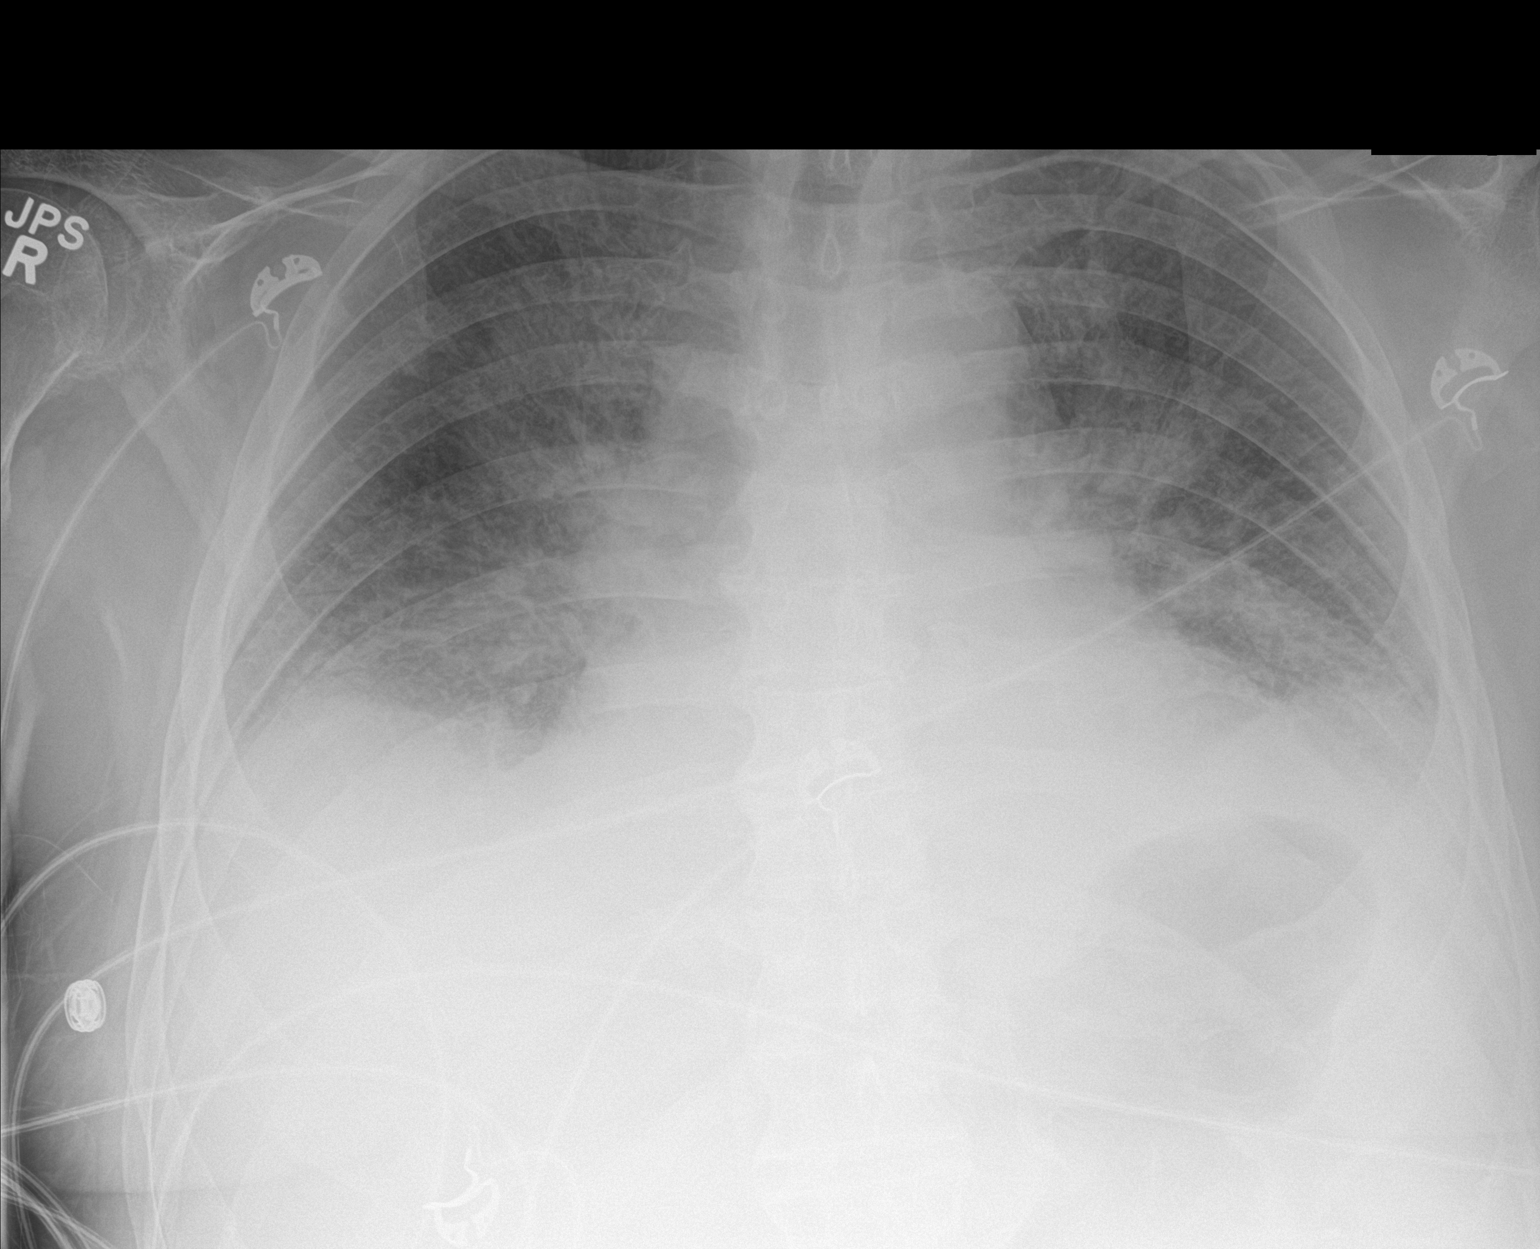

[1 of 1 positions shown; findings below may reference images not displayed]

FINDINGS: Unchanged diffuse interstitial opacity with hazy appearance of the
perihilar and basilar lungs. Unchanged cardiomegaly and vascular
pedicle widening. Small pleural effusions are likely. No
pneumothorax.
IMPRESSION: Stable CHF pattern.

## 2017-11-19 IMAGING — US US ABDOMEN LIMITED
1 series · 14 of 25 positions shown · non-contrast
Comparison: CT scan 09/27/2015

CLINICAL DATA: Elevated liver function studies

EXAM:
US ABDOMEN LIMITED - RIGHT UPPER QUADRANT

[Series 1: us abdomen limited · 0.24mm/px · 14 of 46 slices shown]
[im 1/46]
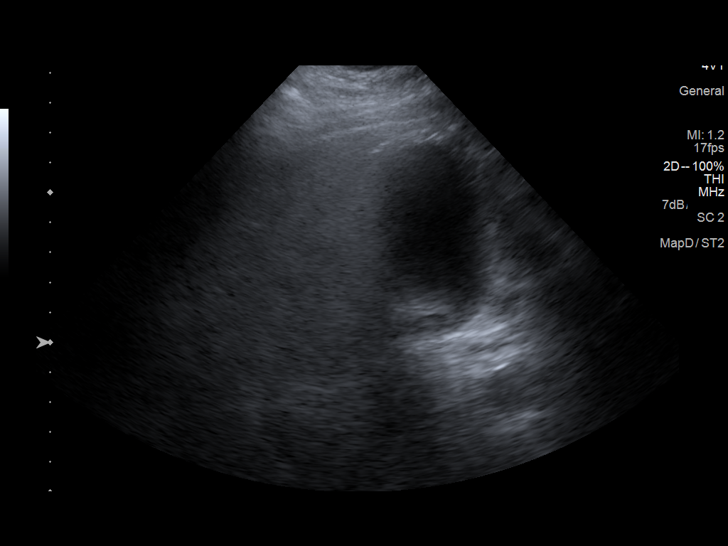
[im 4/46]
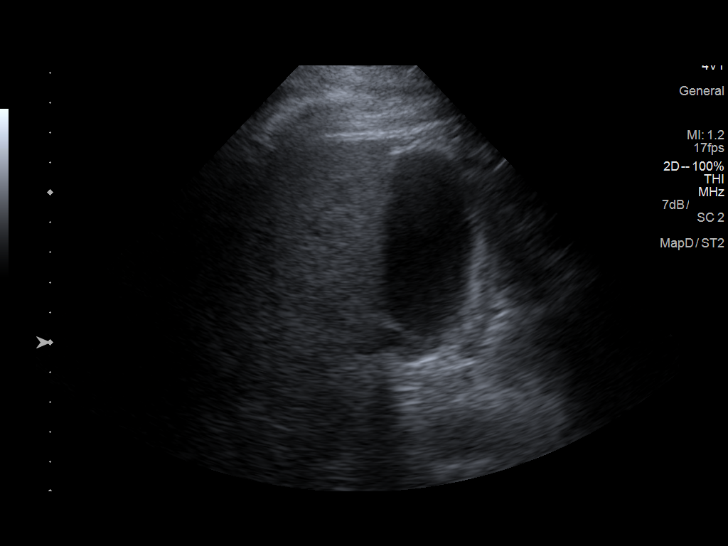
[im 8/46]
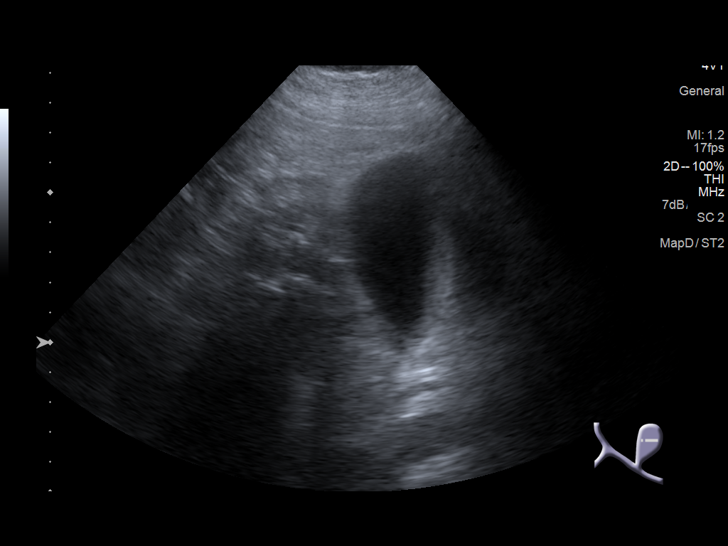
[im 12/46]
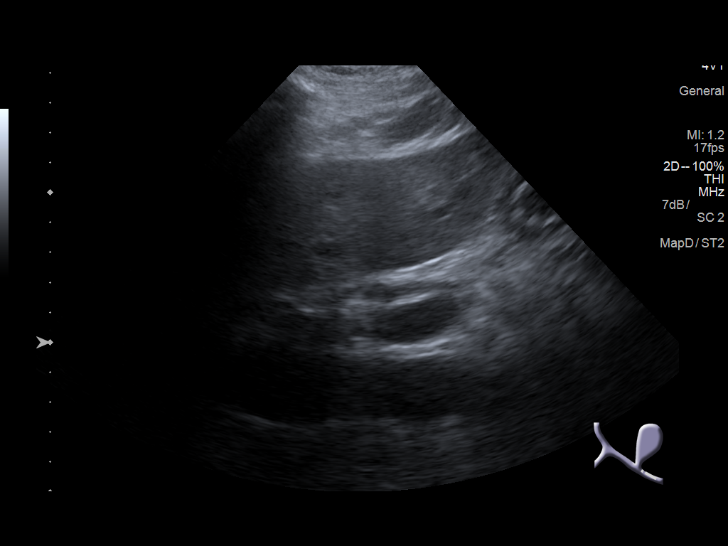
[im 16/46]
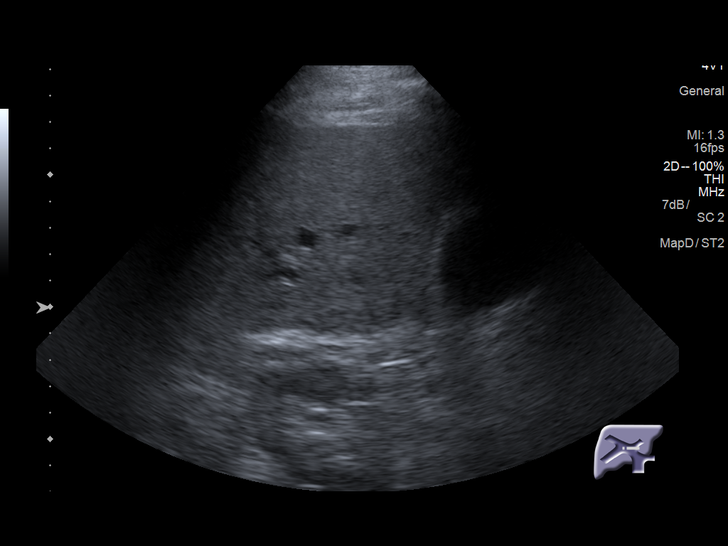
[im 17/46]
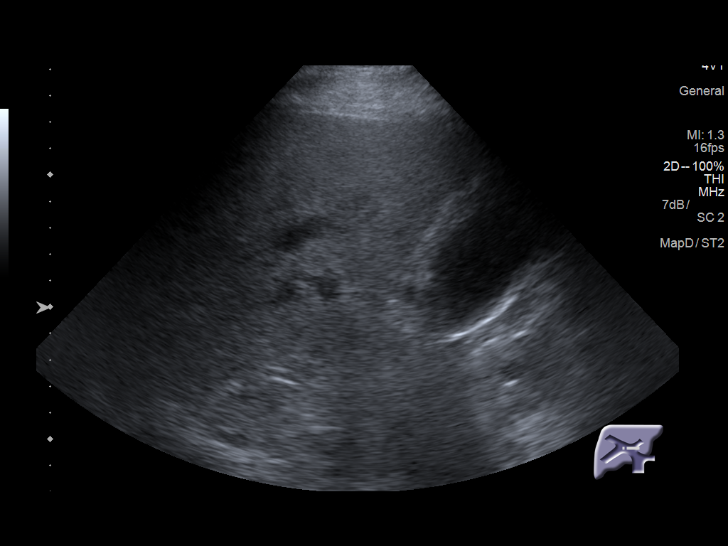
[im 21/46]
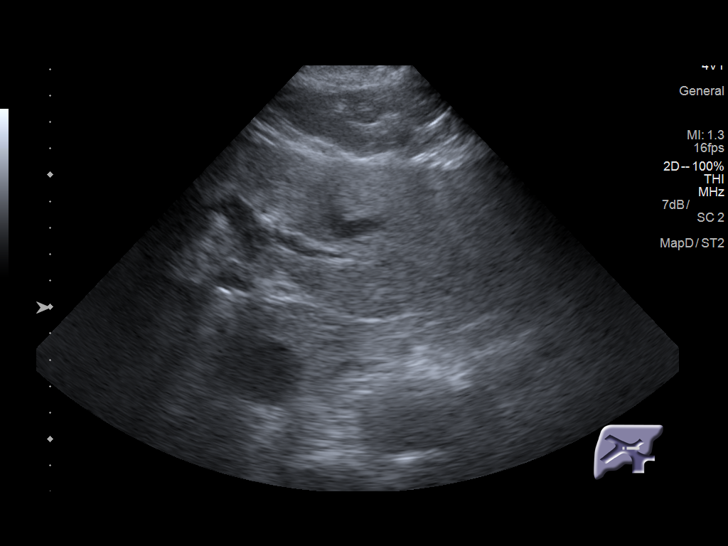
[im 25/46]
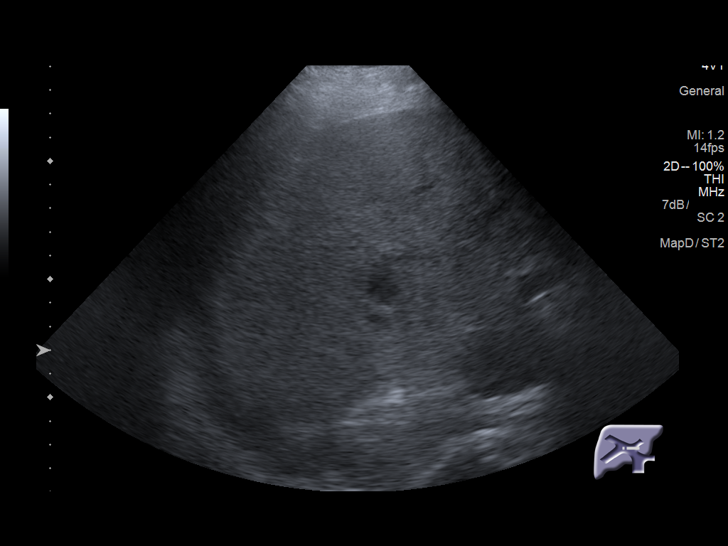
[im 29/46]
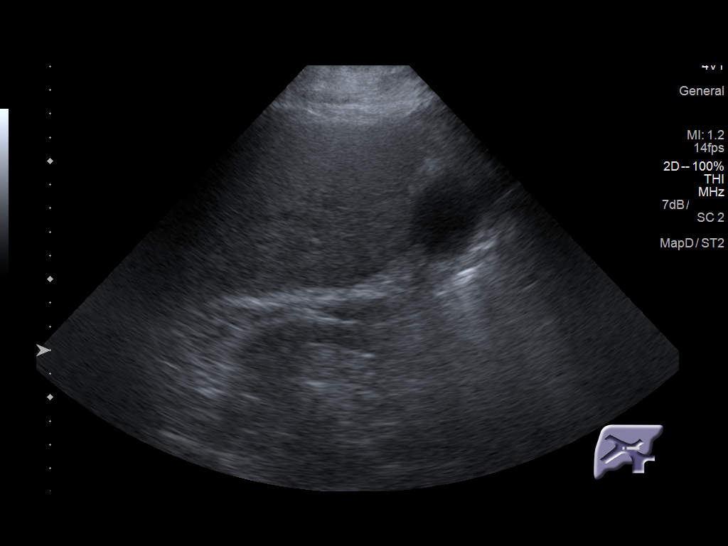
[im 31/46]
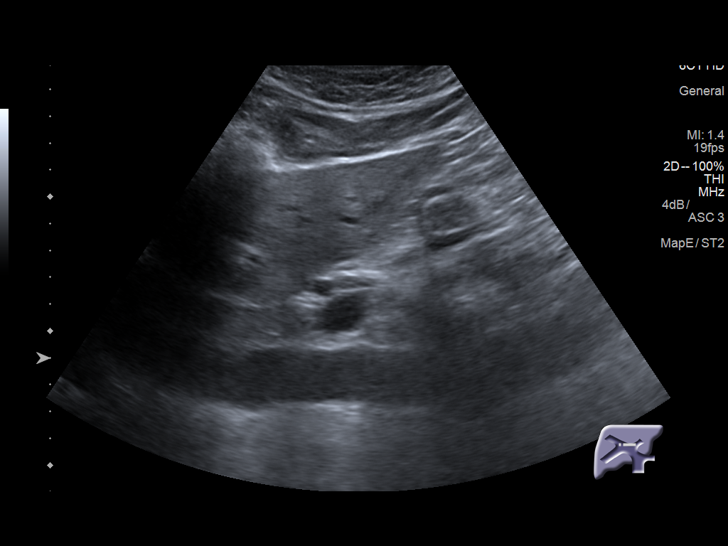
[im 34/46]
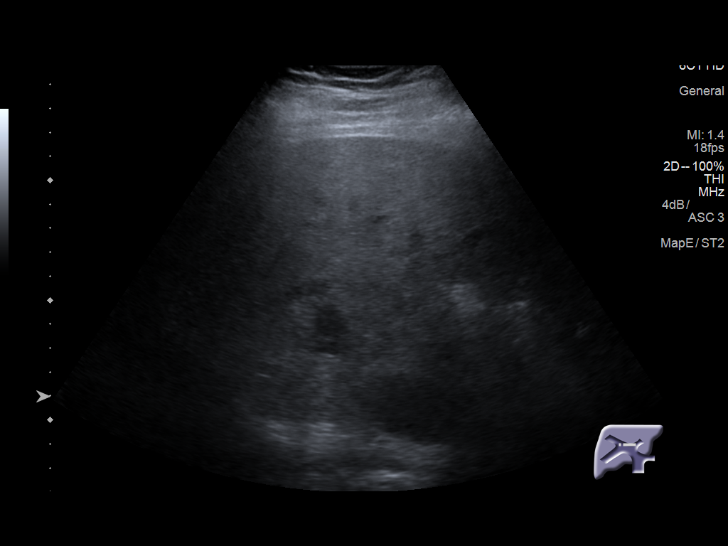
[im 38/46]
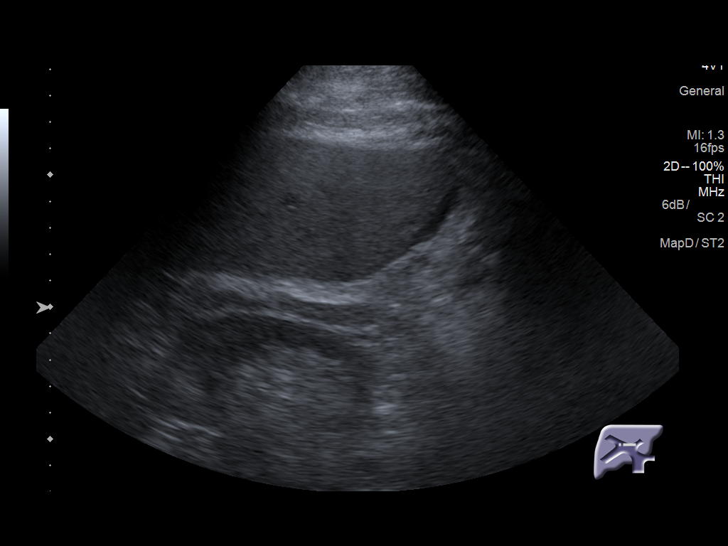
[im 42/46]
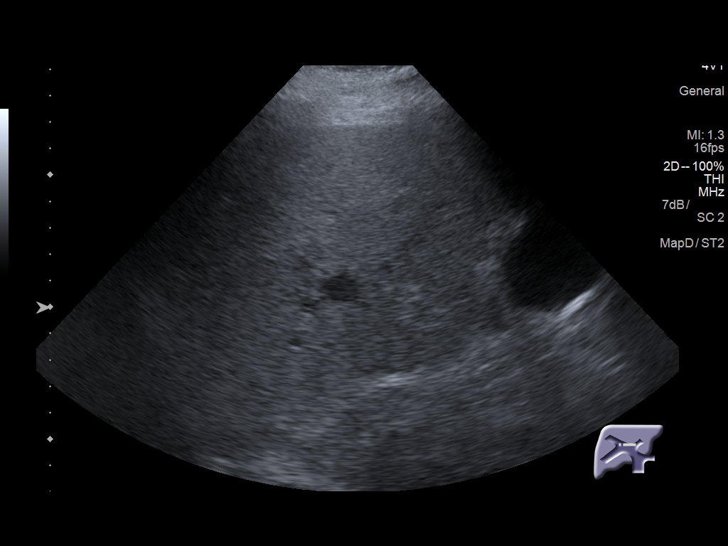
[im 46/46]
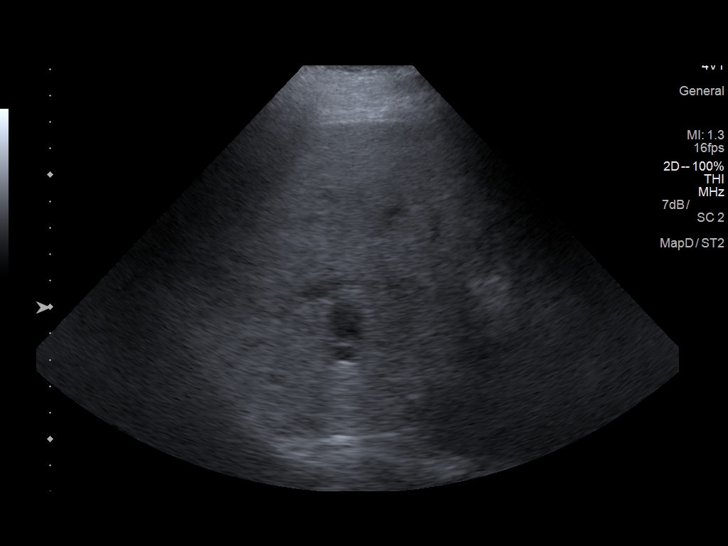

[14 of 25 positions shown; findings below may reference images not displayed]

FINDINGS: Gallbladder:

No gallstones or wall thickening visualized. No sonographic Murphy
sign noted by sonographer.

Common bile duct:

Diameter: 5.7 mm

Liver:

There is diffuse increased echogenicity of the liver and decreased
through transmission consistent with fatty infiltration. No focal
lesions or biliary dilatation.
IMPRESSION: 1. Diffuse fatty infiltration of the liver but no focal hepatic
lesions or intrahepatic biliary dilatation.
2. Normal gallbladder and normal caliber common bile duct.
3. Right pleural effusion.
